# Patient Record
Sex: Female | Born: 1945 | Race: Black or African American | Hispanic: No | Marital: Single | Smoking: Never smoker
Health system: Southern US, Community
[De-identification: ages and names within clinical notes are randomized; demographics above are authoritative.]

## PROBLEM LIST (undated history)

## (undated) DIAGNOSIS — E785 Hyperlipidemia, unspecified: Secondary | ICD-10-CM

## (undated) DIAGNOSIS — E669 Obesity, unspecified: Secondary | ICD-10-CM

## (undated) HISTORY — DX: Hyperlipidemia, unspecified: E78.5

## (undated) HISTORY — PX: CATARACT EXTRACTION: SUR2

## (undated) HISTORY — PX: EYE SURGERY: SHX253

## (undated) HISTORY — DX: Obesity, unspecified: E66.9

---

## 1998-01-24 ENCOUNTER — Other Ambulatory Visit: Admission: RE | Admit: 1998-01-24 | Discharge: 1998-01-24 | Payer: Self-pay | Admitting: Family Medicine

## 1998-07-25 ENCOUNTER — Encounter: Payer: Self-pay | Admitting: Family Medicine

## 1998-07-26 ENCOUNTER — Inpatient Hospital Stay (HOSPITAL_COMMUNITY): Admission: AD | Admit: 1998-07-26 | Discharge: 1998-07-27 | Payer: Self-pay | Admitting: Family Medicine

## 2000-01-11 ENCOUNTER — Other Ambulatory Visit: Admission: RE | Admit: 2000-01-11 | Discharge: 2000-01-11 | Payer: Self-pay | Admitting: Family Medicine

## 2000-01-18 ENCOUNTER — Encounter: Payer: Self-pay | Admitting: Family Medicine

## 2000-01-18 ENCOUNTER — Ambulatory Visit (HOSPITAL_COMMUNITY): Admission: RE | Admit: 2000-01-18 | Discharge: 2000-01-18 | Payer: Self-pay | Admitting: Family Medicine

## 2000-05-25 ENCOUNTER — Observation Stay (HOSPITAL_COMMUNITY): Admission: RE | Admit: 2000-05-25 | Discharge: 2000-05-25 | Payer: Self-pay | Admitting: Obstetrics and Gynecology

## 2000-05-25 ENCOUNTER — Encounter (INDEPENDENT_AMBULATORY_CARE_PROVIDER_SITE_OTHER): Payer: Self-pay

## 2000-05-25 ENCOUNTER — Encounter (INDEPENDENT_AMBULATORY_CARE_PROVIDER_SITE_OTHER): Payer: Self-pay | Admitting: Specialist

## 2000-06-22 ENCOUNTER — Inpatient Hospital Stay (HOSPITAL_COMMUNITY): Admission: AD | Admit: 2000-06-22 | Discharge: 2000-06-22 | Payer: Self-pay | Admitting: Obstetrics and Gynecology

## 2000-11-09 ENCOUNTER — Encounter: Payer: Self-pay | Admitting: Family Medicine

## 2000-11-09 ENCOUNTER — Ambulatory Visit (HOSPITAL_COMMUNITY): Admission: RE | Admit: 2000-11-09 | Discharge: 2000-11-09 | Payer: Self-pay | Admitting: Family Medicine

## 2001-03-10 ENCOUNTER — Encounter: Payer: Self-pay | Admitting: Family Medicine

## 2001-03-10 ENCOUNTER — Ambulatory Visit (HOSPITAL_COMMUNITY): Admission: RE | Admit: 2001-03-10 | Discharge: 2001-03-10 | Payer: Self-pay | Admitting: Family Medicine

## 2002-04-27 ENCOUNTER — Encounter: Payer: Self-pay | Admitting: Family Medicine

## 2002-04-27 ENCOUNTER — Ambulatory Visit (HOSPITAL_COMMUNITY): Admission: RE | Admit: 2002-04-27 | Discharge: 2002-04-27 | Payer: Self-pay | Admitting: Family Medicine

## 2002-11-21 ENCOUNTER — Ambulatory Visit (HOSPITAL_COMMUNITY): Admission: RE | Admit: 2002-11-21 | Discharge: 2002-11-21 | Payer: Self-pay | Admitting: Family Medicine

## 2002-11-21 ENCOUNTER — Encounter: Payer: Self-pay | Admitting: Family Medicine

## 2003-05-01 ENCOUNTER — Ambulatory Visit (HOSPITAL_COMMUNITY): Admission: RE | Admit: 2003-05-01 | Discharge: 2003-05-01 | Payer: Self-pay | Admitting: Family Medicine

## 2003-05-04 HISTORY — PX: MYOMECTOMY: SHX85

## 2003-12-10 ENCOUNTER — Other Ambulatory Visit: Admission: RE | Admit: 2003-12-10 | Discharge: 2003-12-10 | Payer: Self-pay | Admitting: Family Medicine

## 2004-04-30 ENCOUNTER — Ambulatory Visit (HOSPITAL_COMMUNITY): Admission: RE | Admit: 2004-04-30 | Discharge: 2004-04-30 | Payer: Self-pay | Admitting: Orthopedic Surgery

## 2004-05-06 ENCOUNTER — Encounter (HOSPITAL_COMMUNITY): Admission: RE | Admit: 2004-05-06 | Discharge: 2004-06-05 | Payer: Self-pay | Admitting: Orthopedic Surgery

## 2004-05-27 ENCOUNTER — Ambulatory Visit (HOSPITAL_COMMUNITY): Admission: RE | Admit: 2004-05-27 | Discharge: 2004-05-27 | Payer: Self-pay | Admitting: Family Medicine

## 2005-05-31 ENCOUNTER — Ambulatory Visit (HOSPITAL_COMMUNITY): Admission: RE | Admit: 2005-05-31 | Discharge: 2005-05-31 | Payer: Self-pay | Admitting: Family Medicine

## 2006-06-01 ENCOUNTER — Ambulatory Visit (HOSPITAL_COMMUNITY): Admission: RE | Admit: 2006-06-01 | Discharge: 2006-06-01 | Payer: Self-pay | Admitting: Family Medicine

## 2007-06-05 ENCOUNTER — Ambulatory Visit (HOSPITAL_COMMUNITY): Admission: RE | Admit: 2007-06-05 | Discharge: 2007-06-05 | Payer: Self-pay | Admitting: Family Medicine

## 2008-06-05 ENCOUNTER — Ambulatory Visit (HOSPITAL_COMMUNITY): Admission: RE | Admit: 2008-06-05 | Discharge: 2008-06-05 | Payer: Self-pay | Admitting: Family Medicine

## 2008-07-15 ENCOUNTER — Encounter (INDEPENDENT_AMBULATORY_CARE_PROVIDER_SITE_OTHER): Payer: Self-pay | Admitting: Gastroenterology

## 2008-07-15 ENCOUNTER — Ambulatory Visit (HOSPITAL_COMMUNITY): Admission: RE | Admit: 2008-07-15 | Discharge: 2008-07-15 | Payer: Self-pay | Admitting: Gastroenterology

## 2008-09-16 ENCOUNTER — Encounter: Admission: RE | Admit: 2008-09-16 | Discharge: 2008-09-16 | Payer: Self-pay | Admitting: Family Medicine

## 2008-11-14 ENCOUNTER — Other Ambulatory Visit: Admission: RE | Admit: 2008-11-14 | Discharge: 2008-11-14 | Payer: Self-pay | Admitting: Family Medicine

## 2009-06-16 ENCOUNTER — Ambulatory Visit (HOSPITAL_COMMUNITY): Admission: RE | Admit: 2009-06-16 | Discharge: 2009-06-16 | Payer: Self-pay | Admitting: Family Medicine

## 2010-05-23 ENCOUNTER — Encounter: Payer: Self-pay | Admitting: Family Medicine

## 2010-05-28 ENCOUNTER — Other Ambulatory Visit (HOSPITAL_COMMUNITY): Payer: Self-pay | Admitting: Family Medicine

## 2010-05-28 DIAGNOSIS — Z139 Encounter for screening, unspecified: Secondary | ICD-10-CM

## 2010-06-18 ENCOUNTER — Ambulatory Visit (HOSPITAL_COMMUNITY)
Admission: RE | Admit: 2010-06-18 | Discharge: 2010-06-18 | Disposition: A | Discharge status: Home / Self Care | Payer: PRIVATE HEALTH INSURANCE | Source: Ambulatory Visit | Attending: Family Medicine | Admitting: Family Medicine

## 2010-06-18 ENCOUNTER — Ambulatory Visit (HOSPITAL_COMMUNITY): Payer: Self-pay

## 2010-06-18 DIAGNOSIS — Z1231 Encounter for screening mammogram for malignant neoplasm of breast: Secondary | ICD-10-CM | POA: Insufficient documentation

## 2010-06-18 DIAGNOSIS — Z139 Encounter for screening, unspecified: Secondary | ICD-10-CM

## 2010-09-18 NOTE — H&P (Signed)
Amanda Estes  Patient:    [name], Estes                      MRN: [id number] Attending:  Seymour Bars. Leo Grosser, M.D. Dictator:   Jon Billings. Elizebeth Koller.                         History and Physical  DATE OF BIRTH:                February 28, 1946  HISTORY OF PRESENT ILLNESS:   Ms. Amanda Estes is a 65 year old single African-American female para 0 who is menopausal with non-insulin-dependent diabetes complaining of a three month history of intermittent dull right lower quadrant abdominal pain.  Patient received a pelvic ultrasound January 18, 2000 which revealed a solid appearing right adnexal mass measuring just under 3 cm in maximum dimension.  She was also found to have two discreet fibroids, however, remainder of that study was within normal limits.  Patients pelvic ultrasound was followed by an MRI February 05, 2000 which revealed a 3 cm mass just to the right midline above the bladder and adjacent to the uterus that was believed to represent a pedunculated fibroid as it appeared to be connected to the uterus on the contrast enhanced images.  There were also two small fibroids within the posterior wall of the uterus measuring slightly less than 1 cm in diameter.  The remainder of that study was unremarkable.  Patient has consented to undergo a diagnostic laparoscopy in order to identify the nature of this pelvic mass and possible laparoscopic myomectomy.  PAST OBSTETRICAL HISTORY:     Negative.  PAST GYNECOLOGICAL HISTORY:   Menarche 65 years old.  Prior to menopause patient had a history of oligomenorrhea.  Her Pap smear of September 2001 and mammogram of October 2001 were both normal.  PAST MEDICAL HISTORY:         Non-insulin-dependent diabetes.  PAST SURGICAL HISTORY:        Wisdom tooth extraction.  CURRENT MEDICATIONS:          Glucophage XR, Amaryl, Actos, Accupril, HCTZ.  ALLERGIES:                    AUGMENTIN which causes shortness of breath  and FLU SHOT which cause hives.  SOCIAL HISTORY:               Patient does not smoke, drink alcohol, or use recreational drugs.  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  FAMILY HISTORY:               Positive for heart disease, breast cancer (maternal grandmother and aunt), hypertension, diabetes, and seizures.  REVIEW OF SYSTEMS:            Negative except as indicated in HPI.  PHYSICAL EXAMINATION:  VITAL SIGNS:                  Blood pressure 120/78, weight 220 pounds, height 5 feet 0.75 inches.  HEENT:                        Normal.  NECK:                         Supple without masses.  There is no thyromegaly.  LUNGS:  Without wheezes, rales, or rhonchi.  HEART:                        Regular rate and rhythm.  BACK:                         No CVA tenderness.  ABDOMEN:                      Bowel sounds are present.  Soft, nontender. There is no organomegaly.  EXTREMITIES:                  No cyanosis, clubbing, or edema.  PELVIC:                       EGBUS is within normal limits.  Vagina is normal.  Cervix is nontender without lesion.  Uterus appears normal size, shape, and consistency.  It is anterior.  Adnexa without masses or tenderness. Rectovaginal without masses or tenderness.  IMPRESSION:                   1. Right adnexal mass.                               2. History of pelvic pain.                               3. Menopausal.  DISPOSITION:                  A long discussion was held with patient regarding implications for her procedure along with risks involved to include, but not limited to, infection, reaction to anesthesia, bleeding, and damage to adjacent organs.  Patient has consented to undergo a diagnostic laparoscopy with possible laparoscopic myomectomy at [place] on Wednesday, May 25, 2000 at 7:30 a.m. DD:  05/24/00 TD:  05/24/00 Job:  [id number]

## 2010-09-18 NOTE — Op Note (Signed)
NAME:  Amanda Estes, Amanda Estes             ACCOUNT NO.:  [id number]   MEDICAL RECORD NO.:  [id number]          PATIENT TYPE:  AMB   LOCATION:  ENDO                         FACILITY:  [facility]   PHYSICIAN:  Anselmo Rod, M.D.  DATE OF BIRTH:  [birth year]   DATE OF PROCEDURE:  07/17/2008  DATE OF DISCHARGE:  07/15/2008                               OPERATIVE REPORT   PROCEDURE PERFORMED:  Colonoscopy with cold biopsies x 2.   ENDOSCOPIST:  Anselmo Rod, M.D.   INSTRUMENT USED:  Pentax video colonoscope.   INDICATIONS FOR PROCEDURE:  A 65 year old female undergoing screening  colonoscopy to rule out colonic polyps, masses, etc   PREPROCEDURE PREPARATION:  Informed consent was procured from the  patient.  The patient fasted for 4 hours prior to procedure and prepped  with Moviprep the night prior to procedure and the morning of the  procedure.  The risks and benefits of the procedure including a 10% miss  rate of cancer and polyp were discussed with the patient as well.   PREPROCEDURE PHYSICAL:  The patient had stable vital signs.  Neck  supple.  Chest clear to auscultation.  S1, S2 regular.  Abdomen soft  with normal bowel sounds.   DESCRIPTION OF PROCEDURE:  The patient was placed in left lateral  decubitus position and sedated with 100 mcg of Fentanyl and 9 mg of  Versed given intravenously in slow incremental doses.  Once the patient  was adequately sedated and maintained on low-flow oxygen and continuous  cardiac monitoring.  The Pentax video colonoscope was advanced from the  rectum to the cecum.  One small sessile polyp was biopsied at 50 cm and  was removed by cold biopsies x2.  The rest of colonoscopy up to the  terminal ileum appeared normal.  The appendiceal orifice and cecal valve  were clearly visualized and photographed.  Retroflexion in the rectum  revealed no abnormalities.  The patient tolerated the procedure well  without immediate complications.   IMPRESSION:  One  small sessile polyp removed from 50 cm (cold biopsies x  2), otherwise normal colonoscopy up to the terminal ileum.   RECOMMENDATIONS:  1. Continue high fiber diet.  2. Avoid nonsteroidals for next 2 weeks.  3. Await pathology results.  4. Repeat colonoscopy depending on pathology results.  5. Outpatient follow-up as need arises in the future.      Anselmo Rod, M.D.  Electronically Signed     JNM/MEDQ  D:  07/17/2008  T:  07/17/2008  Job:  [id number]   cc:   Renaye Rakers, M.D.  Fax: [phone]

## 2011-05-25 ENCOUNTER — Other Ambulatory Visit (HOSPITAL_COMMUNITY): Payer: Self-pay | Admitting: Family Medicine

## 2011-05-25 DIAGNOSIS — Z139 Encounter for screening, unspecified: Secondary | ICD-10-CM

## 2011-06-21 ENCOUNTER — Ambulatory Visit (HOSPITAL_COMMUNITY)
Admission: RE | Admit: 2011-06-21 | Discharge: 2011-06-21 | Disposition: A | Discharge status: Home / Self Care | Payer: 59 | Source: Ambulatory Visit | Attending: Family Medicine | Admitting: Family Medicine

## 2011-06-21 DIAGNOSIS — Z139 Encounter for screening, unspecified: Secondary | ICD-10-CM

## 2011-06-21 DIAGNOSIS — Z1231 Encounter for screening mammogram for malignant neoplasm of breast: Secondary | ICD-10-CM | POA: Insufficient documentation

## 2011-11-13 ENCOUNTER — Encounter: Payer: Self-pay | Admitting: Dietician

## 2011-11-13 ENCOUNTER — Encounter: Payer: 59 | Attending: Family Medicine | Admitting: Dietician

## 2011-11-13 NOTE — Progress Notes (Signed)
Patient was seen on 11/13/2011 for the complete series of three diabetes self-management courses at [facility].  Current A1c = 9.1% The following learning objectives were met by the patient during this course:  Core 1:  Gain an understanding of diabetes and what causes it  Learn how diabetes is treated and the goals of treatment  Learn why, how and when to test BG  Learn how carbohydrate affects your glucose  Receive a personal food plan and learn how to count carbohydrates  Discover how physical activity enhances glucose control and overall health  Begin to gain confidence in your ability to manage diabetes  Handouts given during this class include:  Type 2 Diabetes: Basics Book  My [place] and Activity Log  Core 2:  Describe causes, symptoms and treatment of hypoglycemia and hyperglycemia  Learn how to care for your glucose meter and strips  Explain how to manage diabetes during illness  List strategies to follow meal plan when dining out  Describe the effects of alcohol on glucose and how to use it safely  Describe problem solving skills for day-to-day glucose challenges  Describe ways to remain physically active  Describe the impact of regular activity on insulin resistance  Handouts given in this class:  Refrigerator magnet for Sick Day Guidelines  [facility] Oral Medication and Insulin handout  Core 3  Describe how diabetes changes over time  Understand why glucose may be out of target  Learn how diabetes changes over time  Learn about blood pressure, cholesterol, and heart health  Learn about lowering dietary fat and sodium  Understand the benefits of physical activity for heart health  Develop problem solving skills for times when glucose numbers are puzzling  Develop strategies for creating life balance  Learn how to identify if your treatment plan needs to change  Develop strategies for dealing  with stress, depression and staying motivated  Identify healthy weight-loss plans  Gain confidence that you can succeed in caring for your diabetes  Establish 2-3 goals that they will plan to diligently work on until they return                   for the free 20-month follow-up visit   The following handouts were given in class:  3 Month Follow Up Visit handout  Goal setting handout  Class evaluation form  Your patient has established the following 3 month goals for diabetes self-care:  Count carbohydrates at most of my meals and snacks  Increase aerobic activity one time a week  Take my medications as scheduled  Lose at least 10 pounds to lower my A1c  Test my BG every day and record before and 2 hours after meals  Follow-Up Plan: Patient was offered a 3 month follow-up visit for diabetes self-management education.

## 2011-11-13 NOTE — Patient Instructions (Signed)
Goals:  Follow Diabetes Meal Plan as instructed  Eat 3 meals and 2 snacks, every 3-5 hrs  Limit carbohydrate intake to 30-45 grams carbohydrate/meal  Limit carbohydrate intake to 0-15 grams carbohydrate/snack  Add lean protein foods to meals/snacks  Monitor glucose levels as instructed by your doctor  Aim for 15-30 mins of physical activity daily as tolerated  Bring food record and glucose log to your next nutrition visit 

## 2012-02-22 ENCOUNTER — Ambulatory Visit: Payer: 59 | Admitting: Dietician

## 2012-03-15 ENCOUNTER — Encounter: Payer: 59 | Attending: Family Medicine | Admitting: Dietician

## 2012-03-15 VITALS — Ht 61.0 in | Wt 197.1 lb

## 2012-03-15 DIAGNOSIS — IMO0001 Reserved for inherently not codable concepts without codable children: Secondary | ICD-10-CM | POA: Insufficient documentation

## 2012-03-15 DIAGNOSIS — Z713 Dietary counseling and surveillance: Secondary | ICD-10-CM | POA: Insufficient documentation

## 2012-03-15 NOTE — Progress Notes (Signed)
  Patient was seen on 03/15/2012 for their 3 month follow-up as a part of the diabetes self-management courses at the [facility]. The following learning objectives were met by your patient during this course:  Patient self reports the following:  Diabetes control has improved since diabetes self-management training: yes Number of days blood glucose is >200: 2 days out of 7 days Last MD appointment for diabetes: September Changes in treatment plan: none Confidence with ability to manage diabetes: Confident, but is having issues with eating at regular intervals and at night especially, not having prepared foods available that are in her restricted carb diet and she ends up "eating the wrong things."  This affects her blood glucose and does not help with weight loss Areas for improvement with diabetes self-care: 1.  Lose weight  2. Follow the carb restricted diet  3. Consistently count carbs. Willingness to participate in diabetes support group: Lives in [facility] [facility] and commute is too long.  Please see Diabetes Flow sheet for findings related to patient's self-care.  Follow-Up Plan: Patient is eligible for a "free" 30 minute diabetes self-care appointment in the next year. Patient to call and schedule as needed.

## 2012-06-08 ENCOUNTER — Other Ambulatory Visit (HOSPITAL_COMMUNITY): Payer: Self-pay | Admitting: Family Medicine

## 2012-06-08 DIAGNOSIS — Z139 Encounter for screening, unspecified: Secondary | ICD-10-CM

## 2012-06-17 ENCOUNTER — Other Ambulatory Visit: Payer: Self-pay

## 2012-06-22 ENCOUNTER — Ambulatory Visit (HOSPITAL_COMMUNITY)
Admission: RE | Admit: 2012-06-22 | Discharge: 2012-06-22 | Disposition: A | Discharge status: Home / Self Care | Payer: 59 | Source: Ambulatory Visit | Attending: Family Medicine | Admitting: Family Medicine

## 2012-06-22 DIAGNOSIS — Z139 Encounter for screening, unspecified: Secondary | ICD-10-CM

## 2012-06-22 DIAGNOSIS — Z1231 Encounter for screening mammogram for malignant neoplasm of breast: Secondary | ICD-10-CM | POA: Insufficient documentation

## 2012-12-06 ENCOUNTER — Other Ambulatory Visit: Payer: Self-pay

## 2013-03-05 ENCOUNTER — Ambulatory Visit (INDEPENDENT_AMBULATORY_CARE_PROVIDER_SITE_OTHER): Payer: Self-pay | Admitting: Family Medicine

## 2013-03-05 VITALS — BP 122/62 | HR 66 | Ht 60.0 in | Wt 198.0 lb

## 2013-03-05 DIAGNOSIS — E119 Type 2 diabetes mellitus without complications: Secondary | ICD-10-CM

## 2013-03-05 NOTE — Progress Notes (Signed)
Subjective:  Patient is here for an annual pharmacy visit as part of an employer sponsored wellness program, [employer] to [employer]. Medication list has been updated and reviewed. Current diabetes regimen includes Lantus 46 units, Invokana 300 mg once daily, metformin 2000 mg once daily, tradjenta 5 mg once daily and Novolog sliding scale (8-16 units) with meals.   No major health changes since her last appointment. Novolog and Lantus doses were increased slightly. She still has one cataract and is hoping to have it removed before the year is end. She does not have an appointment pending for the cataract removal.    She has an appointment pending to see Dr. Parke Simmers in a few weeks. She asked to have her A1C checked today.     Disease Assessments:  Diabetes:  Type of Diabetes: Type 2; Year of diagnosis 1993; Sees Diabetes provider 3 times per year; Diabetes Education will attend program group classes; MD managing Diabetes B[place]; Current Diabetes related medical conditions are High blood pressure, High cholesterol; checks feet daily; uses glucometer One Touch Ultra 2 meter; takes medications as prescribed; does not take an aspirin a day; hypoglycemia frequency 0; checks blood glucose 2 times a day;   Lowest CBG 97; Highest CBG 200; Other Diabetes History: She did not bring her CBG meter to the appointment. She has had one episode of low blood sugar- adequately treated with candy and regular soda.   Checking twice daily. Highs and low are patient reported. On average she thinks that it is running around 130-140.    Nutrition-  Patient states that she needs help with carb counting. She would like to see an RD.   B- cheese toast 1 slice, 2 slices bacon, boiled egg, piece of fruit (apple or pineapple); coffee with cream  usually no snack between breakfast and lunch.   L- mostly bringing from home. Turnip greens with jalepeno peppers; casserole chicken (sour cream, chicken soup); mostly trying to avoid  fried foods. Admits to occasionally eating fried chicken. Does try to get salad in too.   D- greens, tomatoes, cucumbers; either chicken or [place] using baking or broiling.   She admits that nightime snacking is her weakness. Sometimes in the evening while she is reading, on the computer or watching TV- she is eating PB & crackers, sometimes a piece of fruit, or a smoothie.   Physical Activity-  Going to the pool at least times a week for about an hour. Also doing some walking and elliptical machines 2-3 days a week on top of the water aerobics.      Preventive Care:    Hemoglobin A1c: 03/05/2013 7.3    Colonoscopy: 10/12/2006  Dilated Eye Exam: 07/04/2012  Flu vaccine: 01/09/2013  Foot Exam: 06/17/2011  Last mammography: 08/04/2010  Pap Smear: 09/30/2010  Pneumovax: 10/13/2006  Other Preventive Care Notes: Dental visit- every 6 months, last was in February 2014. Has an appointment pending.      Vital Signs:  03/05/2013 12:15 PM (EST) Blood Pressure 122 / 62 mm/HgBMI 38.7; Height 5 ft 0 in; Pulse Rate 66 bpm; Weight 198 lbs    Testing:  Blood Sugar Tests:  Hemoglobin A1c: 7.3 resulted on 03/05/2013    Objective:  Musculoskeletal: feet and toes normal.    Assessment/Plan: Patient is a 67 year old female with DM2. A1C today was 7.3% which is slightly above goal of less than 7%. However, this is an improvement from her previous reading of 8. Patient was very excited that her A1C  had decreased.   Patient continues with physical activity and is getting at least 150 minutes each week. She admits that she could increase her intensity while she is in the pool. We discussed how to find her heart rate and what a target heart rate for her age is. We discussed other exercises she could do in the pool to increase her intensity. Patient agreed to try the backstroke. She also wants to start walking more again. She was walking almost every day but lately has cut back.   I discussed with  patient the new wellness rewards program. I showed her how to sign up for it and claim the tobacco free and annual physical badge in addition to completing the online wellness profile. Patient does not currently meet the requirement for a healthy weight. Patient would like to see a dietitian. I will let Marylu Lund know so she can arrange the referral.   We reviewed carbohydrate counting today and patient seems to have a good knowledge of which foods contain carbohydrates. We reveiwed portion sizes and how to look up nutrition information for certain foods. Patient states that she is confident at how to interepret a food label. We discussed daily carbohydrate goals of 45-60 grams of carbohydrates with each meal and 15 grams with each snack.   I will fax A1C results to Dr. Tedra Senegal office. Marylu Lund will follow up with patient in 3 months time..   Goals for Next Visit-  1. Goal to lose weight. Increase walking during the week. When you are at the pool, check your heart rate. Goal is to be around 120 beats per minute. If you aren't reaching this, you may need to increase intensity. If you feel dizzy or light headed, slow down.  2. Be more mindful about the carbs that you are eating. When you are snacking, reach for a small piece of fruit over peanut butter crackers.   Next appointment to see Marylu Lund is Monday, February 2nd at 11:30 AM.     Ellender Hose. Vivia Ewing, PharmD, BCPS

## 2013-03-08 ENCOUNTER — Other Ambulatory Visit: Payer: Self-pay

## 2013-04-12 ENCOUNTER — Other Ambulatory Visit: Payer: Self-pay | Admitting: Family Medicine

## 2013-04-12 ENCOUNTER — Other Ambulatory Visit (HOSPITAL_COMMUNITY)
Admission: RE | Admit: 2013-04-12 | Discharge: 2013-04-12 | Disposition: A | Discharge status: Home / Self Care | Payer: 59 | Source: Ambulatory Visit | Attending: Family Medicine | Admitting: Family Medicine

## 2013-04-12 DIAGNOSIS — Z01419 Encounter for gynecological examination (general) (routine) without abnormal findings: Secondary | ICD-10-CM | POA: Insufficient documentation

## 2013-04-12 DIAGNOSIS — Z1151 Encounter for screening for human papillomavirus (HPV): Secondary | ICD-10-CM | POA: Insufficient documentation

## 2013-06-04 ENCOUNTER — Other Ambulatory Visit (HOSPITAL_COMMUNITY): Payer: Self-pay | Admitting: Family Medicine

## 2013-06-04 DIAGNOSIS — Z139 Encounter for screening, unspecified: Secondary | ICD-10-CM

## 2013-06-25 ENCOUNTER — Ambulatory Visit (HOSPITAL_COMMUNITY): Payer: 59

## 2013-06-26 ENCOUNTER — Ambulatory Visit (HOSPITAL_COMMUNITY)
Admission: RE | Admit: 2013-06-26 | Discharge: 2013-06-26 | Disposition: A | Discharge status: Home / Self Care | Payer: 59 | Source: Ambulatory Visit | Attending: Family Medicine | Admitting: Family Medicine

## 2013-06-26 ENCOUNTER — Other Ambulatory Visit (HOSPITAL_COMMUNITY): Payer: Self-pay | Admitting: Family Medicine

## 2013-06-26 DIAGNOSIS — Z1231 Encounter for screening mammogram for malignant neoplasm of breast: Secondary | ICD-10-CM

## 2013-08-09 ENCOUNTER — Ambulatory Visit (INDEPENDENT_AMBULATORY_CARE_PROVIDER_SITE_OTHER): Payer: 59 | Admitting: Otolaryngology

## 2013-08-09 DIAGNOSIS — R439 Unspecified disturbances of smell and taste: Secondary | ICD-10-CM

## 2013-08-09 DIAGNOSIS — H6990 Unspecified Eustachian tube disorder, unspecified ear: Secondary | ICD-10-CM

## 2013-08-09 DIAGNOSIS — J343 Hypertrophy of nasal turbinates: Secondary | ICD-10-CM

## 2013-08-09 DIAGNOSIS — H903 Sensorineural hearing loss, bilateral: Secondary | ICD-10-CM

## 2013-08-09 DIAGNOSIS — H6980 Other specified disorders of Eustachian tube, unspecified ear: Secondary | ICD-10-CM

## 2013-09-20 ENCOUNTER — Ambulatory Visit (INDEPENDENT_AMBULATORY_CARE_PROVIDER_SITE_OTHER): Payer: 59 | Admitting: Otolaryngology

## 2013-09-20 DIAGNOSIS — H6990 Unspecified Eustachian tube disorder, unspecified ear: Secondary | ICD-10-CM

## 2013-09-20 DIAGNOSIS — H6980 Other specified disorders of Eustachian tube, unspecified ear: Secondary | ICD-10-CM

## 2013-09-20 DIAGNOSIS — J310 Chronic rhinitis: Secondary | ICD-10-CM

## 2013-12-03 ENCOUNTER — Encounter: Payer: Self-pay | Admitting: Family Medicine

## 2013-12-03 NOTE — Progress Notes (Signed)
Patient ID: Amanda Estes, female   DOB: [birth year], 67 y.o.   MRN: [id number] Reviewed: Agree with our Pharmacologist's documentation and management.

## 2014-03-25 NOTE — Patient Instructions (Signed)
HAILEA EAGLIN  03/25/2014   Your procedure is scheduled on:  04/04/2014  Report to [facility] at 7:00 AM.  Call this number if you have problems the morning of surgery: [phone]   Remember:   Do not eat food or drink liquids after midnight.   Take these medicines the morning of surgery with A SIP OF WATER: Evista    DO NOT TAKE DIABETIC MEDICATION AM OF PROCEDURE AND 1/2 DOSE OF EVENING INSULIN   Do not wear jewelry, make-up or nail polish.  Do not wear lotions, powders, or perfumes. You may wear deodorant.  Do not shave 48 hours prior to surgery. Men may shave face and neck.  Do not bring valuables to the hospital.  [facility] is not responsible for any belongings or valuables.               Contacts, dentures or bridgework may not be worn into surgery.  Leave suitcase in the car. After surgery it may be brought to your room.  For patients admitted to the hospital, discharge time is determined by your treatment team.               Patients discharged the day of surgery will not be allowed to drive home.  Name and phone number of your driver:   Special Instructions: N/A   Please read over the following fact sheets that you were given: Anesthesia Post-op Instructions   PATIENT INSTRUCTIONS POST-ANESTHESIA  IMMEDIATELY FOLLOWING SURGERY:  Do not drive or operate machinery for the first twenty four hours after surgery.  Do not make any important decisions for twenty four hours after surgery or while taking narcotic pain medications or sedatives.  If you develop intractable nausea and vomiting or a severe headache please notify your doctor immediately.  FOLLOW-UP:  Please make an appointment with your surgeon as instructed. You do not need to follow up with anesthesia unless specifically instructed to do so.  WOUND CARE INSTRUCTIONS (if applicable):  Keep a dry clean dressing on the anesthesia/puncture wound site if there is drainage.  Once the wound has quit draining you may  leave it open to air.  Generally you should leave the bandage intact for twenty four hours unless there is drainage.  If the epidural site drains for more than 36-48 hours please call the anesthesia department.  QUESTIONS?:  Please feel free to call your physician or the hospital operator if you have any questions, and they will be happy to assist you.      Cataract Surgery  A cataract is a clouding of the lens of the eye. When a lens becomes cloudy, vision is reduced based on the degree and nature of the clouding. Surgery may be needed to improve vision. Surgery removes the cloudy lens and usually replaces it with a substitute lens (intraocular lens, IOL). LET YOUR EYE DOCTOR KNOW ABOUT:  Allergies to food or medicine.  Medicines taken including herbs, eye drops, over-the-counter medicines, and creams.  Use of steroids (by mouth or creams).  Previous problems with anesthetics or numbing medicine.  History of bleeding problems or blood clots.  Previous surgery.  Other health problems, including diabetes and kidney problems.  Possibility of pregnancy, if this applies. RISKS AND COMPLICATIONS  Infection.  Inflammation of the eyeball (endophthalmitis) that can spread to both eyes (sympathetic ophthalmia).  Poor wound healing.  If an IOL is inserted, it can later fall out of proper position. This is very uncommon.  Clouding of  the part of your eye that holds an IOL in place. This is called an "after-cataract." These are uncommon but easily treated. BEFORE THE PROCEDURE  Do not eat or drink anything except small amounts of water for 8 to 12 before your surgery, or as directed by your caregiver.  Unless you are told otherwise, continue any eye drops you have been prescribed.  Talk to your primary caregiver about all other medicines that you take (both prescription and nonprescription). In some cases, you may need to stop or change medicines near the time of your surgery. This is  most important if you are taking blood-thinning medicine.Do not stop medicines unless you are told to do so.  Arrange for someone to drive you to and from the procedure.  Do not put contact lenses in either eye on the day of your surgery. PROCEDURE There is more than one method for safely removing a cataract. Your doctor can explain the differences and help determine which is best for you. Phacoemulsification surgery is the most common form of cataract surgery.  An injection is given behind the eye or eye drops are given to make this a painless procedure.  A small cut (incision) is made on the edge of the clear, dome-shaped surface that covers the front of the eye (cornea).  A tiny probe is painlessly inserted into the eye. This device gives off ultrasound waves that soften and break up the cloudy center of the lens. This makes it easier for the cloudy lens to be removed by suction.  An IOL may be implanted.  The normal lens of the eye is covered by a clear capsule. Part of that capsule is intentionally left in the eye to support the IOL.  Your surgeon may or may not use stitches to close the incision. There are other forms of cataract surgery that require a larger incision and stitches to close the eye. This approach is taken in cases where the doctor feels that the cataract cannot be easily removed using phacoemulsification. AFTER THE PROCEDURE  When an IOL is implanted, it does not need care. It becomes a permanent part of your eye and cannot be seen or felt.  Your doctor will schedule follow-up exams to check on your progress.  Review your other medicines with your doctor to see which can be resumed after surgery.  Use eye drops or take medicine as prescribed by your doctor. Document Released: 04/08/2011 Document Revised: 09/03/2013 Document Reviewed: 04/08/2011 [facility] Patient Information 2015 [place], [place]. This information is not intended to replace advice given to you by  your health care provider. Make sure you discuss any questions you have with your health care provider.

## 2014-03-26 ENCOUNTER — Encounter (HOSPITAL_COMMUNITY)
Admission: RE | Admit: 2014-03-26 | Discharge: 2014-03-26 | Disposition: A | Discharge status: Home / Self Care | Payer: 59 | Source: Ambulatory Visit | Attending: Ophthalmology | Admitting: Ophthalmology

## 2014-03-26 ENCOUNTER — Encounter (HOSPITAL_COMMUNITY): Payer: Self-pay

## 2014-03-26 ENCOUNTER — Other Ambulatory Visit: Payer: Self-pay

## 2014-03-26 DIAGNOSIS — Z01818 Encounter for other preprocedural examination: Secondary | ICD-10-CM | POA: Diagnosis present

## 2014-03-26 LAB — BASIC METABOLIC PANEL
Anion gap: 13 (ref 5–15)
BUN: 20 mg/dL (ref 6–23)
CO2: 26 mEq/L (ref 19–32)
Calcium: 9.5 mg/dL (ref 8.4–10.5)
Chloride: 98 mEq/L (ref 96–112)
Creatinine, Ser: 0.84 mg/dL (ref 0.50–1.10)
GFR calc Af Amer: 82 mL/min — ABNORMAL LOW (ref >90)
GFR calc non Af Amer: 70 mL/min — ABNORMAL LOW (ref >90)
Glucose, Bld: 190 mg/dL — ABNORMAL HIGH (ref 70–99)
Potassium: 4.4 mEq/L (ref 3.7–5.3)
Sodium: 137 mEq/L (ref 137–147)

## 2014-03-26 LAB — HEMOGLOBIN AND HEMATOCRIT, BLOOD
HCT: 40.5 % (ref 36.0–46.0)
Hemoglobin: 13.4 g/dL (ref 12.0–15.0)

## 2014-04-03 ENCOUNTER — Encounter: Payer: Self-pay | Admitting: Nutrition

## 2014-04-03 ENCOUNTER — Encounter: Payer: 59 | Attending: Family Medicine | Admitting: Nutrition

## 2014-04-03 VITALS — Ht 61.0 in | Wt 190.8 lb

## 2014-04-03 DIAGNOSIS — E669 Obesity, unspecified: Secondary | ICD-10-CM | POA: Diagnosis not present

## 2014-04-03 DIAGNOSIS — E118 Type 2 diabetes mellitus with unspecified complications: Secondary | ICD-10-CM | POA: Diagnosis present

## 2014-04-03 DIAGNOSIS — Z794 Long term (current) use of insulin: Secondary | ICD-10-CM | POA: Diagnosis not present

## 2014-04-03 DIAGNOSIS — IMO0002 Reserved for concepts with insufficient information to code with codable children: Secondary | ICD-10-CM

## 2014-04-03 DIAGNOSIS — Z713 Dietary counseling and surveillance: Secondary | ICD-10-CM | POA: Insufficient documentation

## 2014-04-03 DIAGNOSIS — Z6835 Body mass index (BMI) 35.0-35.9, adult: Secondary | ICD-10-CM | POA: Diagnosis not present

## 2014-04-03 DIAGNOSIS — E1165 Type 2 diabetes mellitus with hyperglycemia: Secondary | ICD-10-CM

## 2014-04-03 MED ORDER — TETRACAINE HCL 0.5 % OP SOLN
OPHTHALMIC | Status: AC
  Filled 2014-04-03: qty 2

## 2014-04-03 MED ORDER — LIDOCAINE HCL (PF) 1 % IJ SOLN
INTRAMUSCULAR | Status: AC
  Filled 2014-04-03: qty 2

## 2014-04-03 MED ORDER — LIDOCAINE HCL 3.5 % OP GEL
OPHTHALMIC | Status: AC
  Filled 2014-04-03: qty 1

## 2014-04-03 MED ORDER — NEOMYCIN-POLYMYXIN-DEXAMETH 3.5-10000-0.1 OP SUSP
OPHTHALMIC | Status: AC
  Filled 2014-04-03: qty 5

## 2014-04-03 MED ORDER — CYCLOPENTOLATE-PHENYLEPHRINE OP SOLN OPTIME - NO CHARGE
OPHTHALMIC | Status: AC
  Filled 2014-04-03: qty 2

## 2014-04-03 MED ORDER — PHENYLEPHRINE HCL 2.5 % OP SOLN
OPHTHALMIC | Status: AC
  Filled 2014-04-03: qty 15

## 2014-04-03 NOTE — Progress Notes (Signed)
  Medical Nutrition Therapy:  Appt start time:1130 end time:  1230.   Assessment:  Primary concerns today: Diabetes and weight loss. Most A1C 6.4%. It has been has high at 9% 6 months ago. LIves by herself and does own cooking and shopping. Eats out twice a week. Tries to bake and broil mostly. Physical activity; goes to [facility] three times a week and does 30 minutes on other days. Has lost 2 lbs previously. Would like to lose 30-40 lbs pouinds.   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 Timing of meals may vary depending on work schedule.  Currently takes 46 units of Lantus at night and then takes 4 units of Novolog with breakfast and dinner but not lunch plus sliding scale. On Tradjenta 5 mg daily and Glucophage 1000 mg BID. Tests her blood sugars but didn' t bring them with her.  Preferred Learning Style:     Hands on  No preference indicated    Learning Readiness:  Contemplating  Ready  Change in progress   MEDICATIONS See list  DIETARY INTAKE:  24-hr recall:  B ( AM):2 slices bacon, 1 egg,  Apple, water and coffee Snk ( AM): sometimes; fruit or peanuts L ( PM): [name] restaurant; broccoli/chicken or sushi, water with lemon Snk ( PM): none D ( PM): Slaw, turnip green and polish sausage and apple, water Snk ( PM): none Beverages: water  Usual physical activity: Going to the [employer] and walking   Estimated energy needs: 1200 calories 135 g carbohydrates 90 g protein 33 g fat  Progress Towards Goal(s):  In progress.   Nutritional Diagnosis:  NB-1.1 Food and nutrition-related knowledge deficit As related to diabetes.  As evidenced by A1C of 6.4% but was >9% at one time..    Intervention:  Nutrition counseling for weight loss and diabetes education on diet, medications, timing of meals and CHO Counting and reducing complications of DM.  Plan:  Aim for 2-3arb Choices per meal (30-45 grams) +/- 1 either way  Avoid snacks between meals. Test blood sugars 2 hours  after largest meal to evaluate blood sugar control. Use MYFitness Pal or other APP to keep food journal and track food intake Include protein in moderation with your meals  Consider reading food labels for Total Carbohydrate and Fat Grams of foods Talk to a personal trainer to assist with maximizing benefits of exercising for desired weight loss. Continue checking BG at alternate times per day as directed by MD. Test before and 2 hours after lunch occasionally to see if insulin is needed at that time. Continuer taking medications as directed by MD  Goal: 1.Talk to personal trainer to help with workout 2. Cut down on the fat content of foods and increase fiber from fresh fruits and low carb vegetables 3. Lose 1/2 lb per week.  4.Check blood sugars at lunch time. 5. Bring blood sugar log and food journal at next visit.  Teaching Method Utilized:  Visual Auditory Hands on  Handouts given during visit include:  Carb Counting and Food Label handouts Meal Plan Card  The Plate Method  Barriers to learning/adherence to lifestyle change: none  Demonstrated degree of understanding via:  Teach Back   Monitoring/Evaluation:  Dietary intake, exercise, meal planning, SBG and body weight in 2 month(s).

## 2014-04-04 ENCOUNTER — Encounter (HOSPITAL_COMMUNITY): Payer: Self-pay | Admitting: *Deleted

## 2014-04-04 ENCOUNTER — Ambulatory Visit (HOSPITAL_COMMUNITY)
Admission: RE | Admit: 2014-04-04 | Discharge: 2014-04-04 | Disposition: A | Discharge status: Home / Self Care | Payer: 59 | Source: Ambulatory Visit | Attending: Ophthalmology | Admitting: Ophthalmology

## 2014-04-04 ENCOUNTER — Ambulatory Visit (HOSPITAL_COMMUNITY): Payer: 59 | Admitting: Anesthesiology

## 2014-04-04 ENCOUNTER — Encounter (HOSPITAL_COMMUNITY)
Admission: RE | Disposition: A | Discharge status: Home / Self Care | Payer: Self-pay | Source: Ambulatory Visit | Attending: Ophthalmology

## 2014-04-04 DIAGNOSIS — H25812 Combined forms of age-related cataract, left eye: Secondary | ICD-10-CM | POA: Insufficient documentation

## 2014-04-04 DIAGNOSIS — Z794 Long term (current) use of insulin: Secondary | ICD-10-CM | POA: Insufficient documentation

## 2014-04-04 DIAGNOSIS — Z881 Allergy status to other antibiotic agents status: Secondary | ICD-10-CM | POA: Insufficient documentation

## 2014-04-04 DIAGNOSIS — Z888 Allergy status to other drugs, medicaments and biological substances status: Secondary | ICD-10-CM | POA: Insufficient documentation

## 2014-04-04 DIAGNOSIS — E119 Type 2 diabetes mellitus without complications: Secondary | ICD-10-CM | POA: Diagnosis not present

## 2014-04-04 DIAGNOSIS — I10 Essential (primary) hypertension: Secondary | ICD-10-CM | POA: Insufficient documentation

## 2014-04-04 DIAGNOSIS — Z6835 Body mass index (BMI) 35.0-35.9, adult: Secondary | ICD-10-CM | POA: Diagnosis not present

## 2014-04-04 HISTORY — PX: CATARACT EXTRACTION W/PHACO: SHX586

## 2014-04-04 LAB — GLUCOSE, CAPILLARY: Glucose-Capillary: 129 mg/dL — ABNORMAL HIGH (ref 70–99)

## 2014-04-04 SURGERY — PHACOEMULSIFICATION, CATARACT, WITH IOL INSERTION
Anesthesia: Monitor Anesthesia Care | Site: Eye | Laterality: Left

## 2014-04-04 MED ORDER — NEOMYCIN-POLYMYXIN-DEXAMETH 3.5-10000-0.1 OP SUSP
OPHTHALMIC | Status: DC | PRN
  Administered 2014-04-04: 1 [drp] via OPHTHALMIC

## 2014-04-04 MED ORDER — LACTATED RINGERS IV SOLN
INTRAVENOUS | Status: DC
  Administered 2014-04-04: 1000 mL via INTRAVENOUS

## 2014-04-04 MED ORDER — LIDOCAINE 3.5 % OP GEL OPTIME - NO CHARGE
OPHTHALMIC | Status: DC | PRN
  Administered 2014-04-04: 1 [drp] via OPHTHALMIC

## 2014-04-04 MED ORDER — EPINEPHRINE HCL 1 MG/ML IJ SOLN
INTRAOCULAR | Status: DC | PRN
  Administered 2014-04-04: 500 mL

## 2014-04-04 MED ORDER — TETRACAINE HCL 0.5 % OP SOLN
1.0000 [drp] | OPHTHALMIC | Status: AC
  Administered 2014-04-04 (×3): 1 [drp] via OPHTHALMIC

## 2014-04-04 MED ORDER — LIDOCAINE HCL 3.5 % OP GEL
1.0000 "application " | Freq: Once | OPHTHALMIC | Status: AC
  Administered 2014-04-04: 1 via OPHTHALMIC

## 2014-04-04 MED ORDER — FENTANYL CITRATE 0.05 MG/ML IJ SOLN
25.0000 ug | INTRAMUSCULAR | Status: AC
  Administered 2014-04-04: 25 ug via INTRAVENOUS

## 2014-04-04 MED ORDER — MIDAZOLAM HCL 2 MG/2ML IJ SOLN
INTRAMUSCULAR | Status: AC
  Filled 2014-04-04: qty 2

## 2014-04-04 MED ORDER — BSS IO SOLN
INTRAOCULAR | Status: DC | PRN
  Administered 2014-04-04: 30 mL via INTRAOCULAR

## 2014-04-04 MED ORDER — EPINEPHRINE HCL 1 MG/ML IJ SOLN
INTRAMUSCULAR | Status: AC
  Filled 2014-04-04: qty 1

## 2014-04-04 MED ORDER — LIDOCAINE HCL (PF) 1 % IJ SOLN
INTRAOCULAR | Status: DC | PRN
  Administered 2014-04-04: .8 mL via OPHTHALMIC

## 2014-04-04 MED ORDER — PROVISC 10 MG/ML IO SOLN
INTRAOCULAR | Status: DC | PRN
  Administered 2014-04-04: 0.85 mL via INTRAOCULAR

## 2014-04-04 MED ORDER — PHENYLEPHRINE HCL 2.5 % OP SOLN
1.0000 [drp] | OPHTHALMIC | Status: AC
  Administered 2014-04-04 (×3): 1 [drp] via OPHTHALMIC

## 2014-04-04 MED ORDER — FENTANYL CITRATE 0.05 MG/ML IJ SOLN
INTRAMUSCULAR | Status: AC
  Filled 2014-04-04: qty 2

## 2014-04-04 MED ORDER — MIDAZOLAM HCL 2 MG/2ML IJ SOLN
1.0000 mg | INTRAMUSCULAR | Status: DC | PRN
  Administered 2014-04-04: 2 mg via INTRAVENOUS

## 2014-04-04 MED ORDER — CYCLOPENTOLATE-PHENYLEPHRINE 0.2-1 % OP SOLN
1.0000 [drp] | OPHTHALMIC | Status: AC
  Administered 2014-04-04 (×3): 1 [drp] via OPHTHALMIC

## 2014-04-04 MED ORDER — POVIDONE-IODINE 5 % OP SOLN
OPHTHALMIC | Status: DC | PRN
  Administered 2014-04-04: 1 via OPHTHALMIC

## 2014-04-04 SURGICAL SUPPLY — 33 items
CAPSULAR TENSION RING-AMO (OPHTHALMIC RELATED) IMPLANT
CLOTH BEACON ORANGE TIMEOUT ST (SAFETY) ×2 IMPLANT
EYE SHIELD UNIVERSAL CLEAR (GAUZE/BANDAGES/DRESSINGS) ×2 IMPLANT
GLOVE BIO SURGEON STRL SZ 6.5 (GLOVE) IMPLANT
GLOVE BIOGEL PI IND STRL 6.5 (GLOVE) ×2 IMPLANT
GLOVE BIOGEL PI IND STRL 7.0 (GLOVE) IMPLANT
GLOVE BIOGEL PI IND STRL 7.5 (GLOVE) IMPLANT
GLOVE BIOGEL PI INDICATOR 6.5 (GLOVE) ×2
GLOVE BIOGEL PI INDICATOR 7.0 (GLOVE)
GLOVE BIOGEL PI INDICATOR 7.5 (GLOVE)
GLOVE ECLIPSE 6.5 STRL STRAW (GLOVE) IMPLANT
GLOVE ECLIPSE 7.0 STRL STRAW (GLOVE) IMPLANT
GLOVE ECLIPSE 7.5 STRL STRAW (GLOVE) IMPLANT
GLOVE EXAM NITRILE LRG STRL (GLOVE) IMPLANT
GLOVE EXAM NITRILE MD LF STRL (GLOVE) IMPLANT
GLOVE SKINSENSE NS SZ6.5 (GLOVE)
GLOVE SKINSENSE NS SZ7.0 (GLOVE)
GLOVE SKINSENSE STRL SZ6.5 (GLOVE) IMPLANT
GLOVE SKINSENSE STRL SZ7.0 (GLOVE) IMPLANT
KIT VITRECTOMY (OPHTHALMIC RELATED) IMPLANT
PAD ARMBOARD 7.5X6 YLW CONV (MISCELLANEOUS) ×2 IMPLANT
PROC W NO LENS (INTRAOCULAR LENS)
PROC W SPEC LENS (INTRAOCULAR LENS)
PROCESS W NO LENS (INTRAOCULAR LENS) IMPLANT
PROCESS W SPEC LENS (INTRAOCULAR LENS) IMPLANT
RETRACTOR IRIS SIGHTPATH (OPHTHALMIC RELATED) IMPLANT
RING MALYGIN (MISCELLANEOUS) IMPLANT
SIGHTPATH CAT PROC W REG LENS (Ophthalmic Related) ×2 IMPLANT
SYRINGE LUER LOK 1CC (MISCELLANEOUS) ×2 IMPLANT
TAPE SURG TRANSPORE 1 IN (GAUZE/BANDAGES/DRESSINGS) ×1 IMPLANT
TAPE SURGICAL TRANSPORE 1 IN (GAUZE/BANDAGES/DRESSINGS) ×2
VISCOELASTIC ADDITIONAL (OPHTHALMIC RELATED) IMPLANT
WATER STERILE IRR 250ML POUR (IV SOLUTION) ×2 IMPLANT

## 2014-04-04 NOTE — H&P (Signed)
I have reviewed the H&P, the patient was re-examined, and I have identified no interval changes in medical condition and plan of care since the history and physical of record  

## 2014-04-04 NOTE — Op Note (Signed)
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  Assistants: None  Anesthesia: Topical with MAC  Indications: Painless, progressive loss of vision with compromise of daily activities.  Surgery: Cataract Extraction with Intraocular lens Implant Left Eye  Discription: The patient had dilating drops and viscous lidocaine placed into the Left eye in the pre-op holding area. After transfer to the operating room, a time out was performed. The patient was then prepped and draped. Beginning with a 28 degree blade a paracentesis port was made at the surgeon's 2 o'clock position. The anterior chamber was then filled with 1% non-preserved lidocaine. This was followed by filling the anterior chamber with Provisc.  A 2.29mm keratome blade was used to make a clear corneal incision at the temporal limbus.  A bent cystatome needle was used to create a continuous tear capsulotomy. Hydrodissection was performed with balanced salt solution on a Fine canula. The lens nucleus was then removed using the phacoemulsification handpiece. Residual cortex was removed with the I&A handpiece. The anterior chamber and capsular bag were refilled with Provisc. A posterior chamber intraocular lens was placed into the capsular bag with it's injector. The implant was positioned with the Kuglan hook. The Provisc was then removed from the anterior chamber and capsular bag with the I&A handpiece. Stromal hydration of the main incision and paracentesis port was performed with BSS on a Fine canula. The wounds were tested for leak which was negative. The patient tolerated the procedure well. There were no operative complications. The patient was then transferred to the recovery room in stable condition.  Complications: None  Specimen: None  EBL: None  Prosthetic device: Hoya iSert 250, power 23.5 D, SN  [id number].

## 2014-04-04 NOTE — Anesthesia Preprocedure Evaluation (Signed)
Anesthesia Evaluation  Patient identified by MRN, date of birth, ID band Patient awake    Reviewed: Allergy & Precautions, H&P , NPO status , Patient's Chart, lab work & pertinent test results  Airway Mallampati: II  TM Distance: >3 FB     Dental  (+) Teeth Intact, Partial Upper   Pulmonary neg pulmonary ROS,  breath sounds clear to auscultation        Cardiovascular negative cardio ROS  Rhythm:Regular Rate:Normal     Neuro/Psych    GI/Hepatic negative GI ROS,   Endo/Other  diabetes, Well Controlled, Type 2, Insulin DependentMorbid obesity  Renal/GU      Musculoskeletal   Abdominal   Peds  Hematology   Anesthesia Other Findings   Reproductive/Obstetrics                             Anesthesia Physical Anesthesia Plan  ASA: III  Anesthesia Plan: MAC   Post-op Pain Management:    Induction: Intravenous  Airway Management Planned: Nasal Cannula  Additional Equipment:   Intra-op Plan:   Post-operative Plan:   Informed Consent: I have reviewed the patients History and Physical, chart, labs and discussed the procedure including the risks, benefits and alternatives for the proposed anesthesia with the patient or authorized representative who has indicated his/her understanding and acceptance.     Plan Discussed with:   Anesthesia Plan Comments:         Anesthesia Quick Evaluation

## 2014-04-04 NOTE — Discharge Instructions (Signed)

## 2014-04-04 NOTE — Anesthesia Postprocedure Evaluation (Signed)
  Anesthesia Post-op Note  Patient: Amanda Estes  Procedure(s) Performed: Procedure(s) with comments: CATARACT EXTRACTION PHACO AND INTRAOCULAR LENS PLACEMENT LEFT EYE (Left) - CDE: 13.36  Patient Location: Short Stay  Anesthesia Type:MAC  Level of Consciousness: awake, alert , oriented and patient cooperative  Airway and Oxygen Therapy: Patient Spontanous Breathing  Post-op Pain: none  Post-op Assessment: Post-op Vital signs reviewed, Patient's Cardiovascular Status Stable, Respiratory Function Stable, Patent Airway, No signs of Nausea or vomiting and Pain level controlled  Post-op Vital Signs: Reviewed and stable  Last Vitals:  Filed Vitals:   04/04/14 0950  BP: 137/68  Pulse:   Temp:   Resp: 16    Complications: No apparent anesthesia complications

## 2014-04-04 NOTE — Transfer of Care (Signed)
Immediate Anesthesia Transfer of Care Note  Patient: Amanda Estes  Procedure(s) Performed: Procedure(s) with comments: CATARACT EXTRACTION PHACO AND INTRAOCULAR LENS PLACEMENT LEFT EYE (Left) - CDE: 13.36  Patient Location: Short Stay  Anesthesia Type:MAC  Level of Consciousness: awake, alert  and patient cooperative  Airway & Oxygen Therapy: Patient Spontanous Breathing  Post-op Assessment: Report given to PACU RN, Post -op Vital signs reviewed and stable and Patient moving all extremities  Post vital signs: Reviewed and stable  Complications: No apparent anesthesia complications

## 2014-04-04 NOTE — Patient Instructions (Signed)
Plan:  Aim for 2-3arb Choices per meal (30-45 grams) +/- 1 either way  Avoid snacks between meals. Test blood sugars 2 hours after largest meal to evaluate blood sugar control. Use MYFitness Pal or other APP to keep food journal and track food intake Include protein in moderation with your meals  Consider reading food labels for Total Carbohydrate and Fat Grams of foods Talk to a personal trainer to assist with maximizing benefits of exercising for desired weight loss. Continue checking BG at alternate times per day as directed by MD. Test before and 2 hours after lunch occasionally to see if insulin is needed at that time. Continuer taking medications as directed by MD  Goal: 1.Talk to personal trainer to help with workout 2. Cut down on the fat content of foods and increase fiber from fresh fruits and low carb vegetables 3. Lose 1/2 lb per week.  4.Check blood sugars at lunch time. 5. Bring blood sugar log and food journal at next visit.

## 2014-04-05 ENCOUNTER — Encounter (HOSPITAL_COMMUNITY): Payer: Self-pay | Admitting: Ophthalmology

## 2014-05-09 ENCOUNTER — Encounter: Payer: 59 | Attending: Family Medicine | Admitting: Nutrition

## 2014-05-09 VITALS — Ht 61.0 in | Wt 181.0 lb

## 2014-05-09 DIAGNOSIS — Z6835 Body mass index (BMI) 35.0-35.9, adult: Secondary | ICD-10-CM | POA: Diagnosis not present

## 2014-05-09 DIAGNOSIS — IMO0002 Reserved for concepts with insufficient information to code with codable children: Secondary | ICD-10-CM

## 2014-05-09 DIAGNOSIS — Z713 Dietary counseling and surveillance: Secondary | ICD-10-CM | POA: Diagnosis not present

## 2014-05-09 DIAGNOSIS — E118 Type 2 diabetes mellitus with unspecified complications: Secondary | ICD-10-CM | POA: Insufficient documentation

## 2014-05-09 DIAGNOSIS — Z794 Long term (current) use of insulin: Secondary | ICD-10-CM | POA: Diagnosis not present

## 2014-05-09 DIAGNOSIS — E1165 Type 2 diabetes mellitus with hyperglycemia: Secondary | ICD-10-CM

## 2014-05-09 DIAGNOSIS — E669 Obesity, unspecified: Secondary | ICD-10-CM | POA: Insufficient documentation

## 2014-05-09 NOTE — Progress Notes (Signed)
  Medical Nutrition Therapy:  Appt start time:1130 end time:  1230.   Assessment:  Primary concerns today: Diabetes and weight loss.  Has kept food journal which has helped her become  Sees Dr. Criss Rosales and will get A1C done in end of January. Lost 9 lbs in the last month. Came off Novolog and reduceed Lantus from 46 units down to 22 due to having low blood sugars. She cut out a lot of snacks and junk food and eating more fresh fruits and vegetables. Takes her food with her to work. She is keeping a food journal and it is helping her increase awareness and accountability of what she is eating. She is exercising a lot more and has a FITBIT to track her exercise and water intake.  Preferred Learning Style:     Hands on  No preference indicated    Learning Readiness:  Contemplating  Ready  Change in progress   MEDICATIONS See list  DIETARY INTAKE:  24-hr recall:  B ( AM):Oatmeal or sausage and toast or boiled egg, water Snk ( AM): cut out snacks. L ( PM): 2 veggies and protein-chicken or [place] and now taking her lunch to work, water Snk ( PM): none D ( PM): [place], coleslaw and bake potatoes Snk ( PM): none Beverages: water  Usual physical activity: Trying to get in 10,000 steps now daily; going to the [facility] and  5 days a week.  Estimated energy needs: 1200 calories 135 g carbohydrates 90 g protein 33 g fat  Progress Towards Goal(s):  In progress.   Nutritional Diagnosis:  NB-1.1 Food and nutrition-related knowledge deficit As related to diabetes.  As evidenced by A1C of 6.4% but was >9% at one time..    Intervention:  Nutrition counseling for weight loss and diabetes education on diet, medications, timing of meals and CHO Counting and reducing complications of DM.  Plan:  Keep up the [place]!! Aim for 2-3 carb Choices per meal (30-45 grams) +/- 1 either way  Avoid snacks between meals. Test blood sugars 2 hours after largest meal to evaluate blood sugar  control. Use MYFitness Pal or other APP to keep food journal and track food intake Include protein in moderation with your meals  Consider reading food labels for Total Carbohydrate and Fat Grams of foods Talk to a personal trainer to assist with maximizing benefits of exercising for desired weight loss. Continue checking BG at alternate times per day as directed by MD. Test before and 2 hours after lunch occasionally to see if insulin is needed at that time. Continuer taking medications as directed by MD  Goal: 1.Get A1C down to 5.6%. 2 Lose 2 lbs per month.  Teaching Method Utilized:  Visual Auditory Hands on  Handouts given during visit include:  Carb Counting and Food Label handouts Meal Plan Card  The Plate Method  Barriers to learning/adherence to lifestyle change: none  Demonstrated degree of understanding via:  Teach Back   Monitoring/Evaluation:  Dietary intake, exercise, meal planning, SBG and body weight in 2 month(s).

## 2014-05-09 NOTE — Patient Instructions (Signed)
Plan:  Keep up the [place]!! Aim for 2-3 carb Choices per meal (30-45 grams) +/- 1 either way  Avoid snacks between meals. Test blood sugars 2 hours after largest meal to evaluate blood sugar control. Use MYFitness Pal or other APP to keep food journal and track food intake Include protein in moderation with your meals  Consider reading food labels for Total Carbohydrate and Fat Grams of foods Talk to a personal trainer to assist with maximizing benefits of exercising for desired weight loss. Continue checking BG at alternate times per day as directed by MD. Test before and 2 hours after lunch occasionally to see if insulin is needed at that time. Continuer taking medications as directed by MD  Goal: 1.Get A1C down to 5.6%. 2 Lose 2 lbs per month.

## 2014-06-06 ENCOUNTER — Other Ambulatory Visit (HOSPITAL_COMMUNITY): Payer: Self-pay | Admitting: Family Medicine

## 2014-06-06 DIAGNOSIS — Z1231 Encounter for screening mammogram for malignant neoplasm of breast: Secondary | ICD-10-CM

## 2014-06-27 ENCOUNTER — Ambulatory Visit (HOSPITAL_COMMUNITY)
Admission: RE | Admit: 2014-06-27 | Discharge: 2014-06-27 | Disposition: A | Discharge status: Home / Self Care | Payer: 59 | Source: Ambulatory Visit | Attending: Family Medicine | Admitting: Family Medicine

## 2014-06-27 DIAGNOSIS — Z1231 Encounter for screening mammogram for malignant neoplasm of breast: Secondary | ICD-10-CM | POA: Diagnosis present

## 2014-08-08 ENCOUNTER — Encounter: Payer: 59 | Attending: Family Medicine | Admitting: Nutrition

## 2014-08-08 VITALS — Ht 61.0 in | Wt 172.0 lb

## 2014-08-08 DIAGNOSIS — E1165 Type 2 diabetes mellitus with hyperglycemia: Secondary | ICD-10-CM

## 2014-08-08 DIAGNOSIS — Z713 Dietary counseling and surveillance: Secondary | ICD-10-CM | POA: Diagnosis not present

## 2014-08-08 DIAGNOSIS — E118 Type 2 diabetes mellitus with unspecified complications: Secondary | ICD-10-CM | POA: Diagnosis present

## 2014-08-08 DIAGNOSIS — Z794 Long term (current) use of insulin: Secondary | ICD-10-CM | POA: Insufficient documentation

## 2014-08-08 DIAGNOSIS — IMO0002 Reserved for concepts with insufficient information to code with codable children: Secondary | ICD-10-CM

## 2014-08-09 ENCOUNTER — Encounter: Payer: Self-pay | Admitting: Nutrition

## 2014-08-09 NOTE — Progress Notes (Signed)
  Medical Nutrition Therapy:  Appt start time:1200 end time:  1215.  Assessment:  Primary concerns today: Diabetes and weight loss.  Has kept food journal which has helped her become more aware of what she is eating.  She is doing the [facility] Weight loss Challenge. Wears a pedometer and doing more than 10000 steps per day.  Lost 9 lbs since last visit.   FBS 120's. Before lunch 110's and bedtime 130-140's.  Will get another A1C on Monday.    Eating more consist meals, more fresh fruits and vegetables and has cut out sweets, and processed foods.  No recent changes in her medications recently.Marland Kitchen     Preferred Learning Style:   Reading  Hands on   Learning Readiness:  Ready  Change in progress   MEDICATIONS See list  DIETARY INTAKE:  24-hr recall:  Eating 3 balanced meals now. Drinking lots of water. FitBit keeps her on task of drinking water, moving and make sure she is eating on time.  Usual physical activity: Trying to get in 10,000 steps now daily; going to the [facility] and  5 days a week.  Estimated energy needs: 1200 calories 135 g carbohydrates 90 g protein 33 g fat  Progress Towards Goal(s):  In progress.   Nutritional Diagnosis:  NB-1.1 Food and nutrition-related knowledge deficit As related to diabetes.  As evidenced by A1C of 6.4% but was >9% at one time..    Intervention:  Meal planning, CHO counting, benefits of exercise on insulin resisitance.  Plan:  Keep up the [place]!! Aim for 2-3 carb Choices per meal (30-45 grams) +/- 1 either way  Goal: 1.Get A1C down to 5.6%. 2 Lose 2 lbs per month. Keep exercising!!   Teaching Method Utilized:  Visual Auditory Hands on  Handouts given during visit include:  Carb Counting and Food Label handouts Meal Plan Card  The Plate Method  Barriers to learning/adherence to lifestyle change: none  Demonstrated degree of understanding via:  Teach Back   Monitoring/Evaluation:  Dietary intake, exercise, meal planning,  SBG and body weight in PRN.

## 2014-08-09 NOTE — Patient Instructions (Signed)
  Plan:  Keep up the [place]!! Aim for 2-3 carb Choices per meal (30-45 grams) +/- 1 either way  Goal: 1.Get A1C down to 5.6%. 2 Lose 2 lbs per month. Keep exercising!!

## 2014-08-12 ENCOUNTER — Other Ambulatory Visit: Payer: Self-pay | Admitting: *Deleted

## 2014-08-12 ENCOUNTER — Encounter: Payer: Self-pay | Admitting: *Deleted

## 2014-08-12 VITALS — BP 100/60 | HR 70 | Ht 60.0 in | Wt 167.0 lb

## 2014-08-12 DIAGNOSIS — E119 Type 2 diabetes mellitus without complications: Secondary | ICD-10-CM

## 2014-08-12 NOTE — Patient Outreach (Signed)
[facility]) Care Management   08/12/2014  JARIANA SHUMARD 10/10/1945 [id number]  RODINA PINALES is an 69 y.o. female who presents for routine Link To Wellness follow up.   Subjective:  Austynn has no complaints. States she is participating in the [facility] digital care initiative and is required to check her blood sugar twice daily. Says she checks a fasting and before supper blood sugar. The initiative will last 12 weeks. She also uses the My Fitness Pal app to help her count carbohydrates. She also says she will be attending a class called "[place]" offered by the RD she is seeing [employer].  She also states her long term weight loss goal is to weight 150 lbs.  Objective:   Review of Systems  Constitutional: Negative.     Physical Exam  Constitutional: She is oriented to person, place, and time.  Cardiovascular: Normal rate and regular rhythm.   Neurological: She is alert and oriented to person, place, and time.  Psychiatric: She has a normal mood and affect. Her behavior is normal. Judgment and thought content normal.   Filed Weights   08/12/14 1006  Weight: 167 lb (75.751 kg)   Filed Vitals:   08/12/14 1006  BP: 100/60  Pulse: 70    Current Medications:   Current Outpatient Prescriptions  Medication Sig Dispense Refill  . Canagliflozin (INVOKANA) 300 MG TABS Take 300 mg by mouth daily.    Marland Kitchen ezetimibe (ZETIA) 10 MG tablet Take 10 mg by mouth daily.    . hydrochlorothiazide (HYDRODIURIL) 25 MG tablet Take 25 mg by mouth daily.    . insulin glargine (LANTUS) 100 UNIT/ML injection Inject 46 Units into the skin at bedtime.     Marland Kitchen linagliptin (TRADJENTA) 5 MG TABS tablet Take 5 mg by mouth daily.    . metFORMIN (GLUCOPHAGE) 1000 MG tablet Take 2,000 mg by mouth daily.    . quinapril (ACCUPRIL) 40 MG tablet Take 40 mg by mouth at bedtime.    . raloxifene (EVISTA) 60 MG tablet Take 60 mg by mouth daily.    . clindamycin  (CLEOCIN) 300 MG capsule Take 300 mg by mouth 3 (three) times daily.    . insulin aspart (NOVOLOG) 100 UNIT/ML injection Inject into the skin 3 (three) times daily before meals.    . mometasone (NASONEX) 50 MCG/ACT nasal spray Place 2 sprays into the nose daily.     No current facility-administered medications for this visit.    Functional Status:   In your present state of health, do you have any difficulty performing the following activities: 08/12/2014 03/26/2014  Hearing? N N  Vision? N Y  Difficulty concentrating or making decisions? N N  Walking or climbing stairs? N N  Dressing or bathing? N N  Doing errands, shopping? N N    Fall/Depression Screening:    PHQ 2/9 Scores 04/03/2014  PHQ - 2 Score 0   THN CM Care Plan        Patient Outreach from 08/12/2014 in [place] Problem One  Type II DM not meeting A1C target of <7.0% as evidenced by most recent A1C= 7.6% as stated by patient   Care Plan for Problem One  Active   Interventions for Problem One Long Term Goal  congratulated Shelita on her voluntary participation in the [employer] care initiative and her ongoing weight loss and consistent exercise program, reviewed effects of exercise and  weight loss on glucose control, reviewed DM medications and reveiwed mechanism of action, encouraged patient to attend the [facility] To [facility]ness class and to continue to see Jearld Fenton RD  for ongoing assistance with medical nutritional management  of Type II DM, reviewed blood sugar values and pre and post meal targets and stategies to meet targets consistently, arranged for 3 month Link To Wellness follow up   [facility] Long Term Goal (31-90 days)  Improved glycemic control as evidenced by improved A1C of <7.0% and ongoing weight loss or no weight gain at next Link To Wellness visiti.   THN Long Term Goal Start Date  08/12/14      Assessment:   Type II DM not meeting target A1C of <7.0% as evidenced  by most recent A1C= 7.6% as stated by patient  Plan: RNCM to fax today's office visit note to Dr. Criss Rosales. RNCM will meet quarterly and as needed with patient per Link To Wellness program guidelines to assist with Type II DM self-management and assess patient's progress toward mutually set goals.     Barrington Ellison RN,CCM,CDE [place] Management Coordinator Office Phone [phone] Office Fax [phone]

## 2014-12-09 ENCOUNTER — Other Ambulatory Visit: Payer: Self-pay | Admitting: *Deleted

## 2014-12-09 NOTE — Patient Outreach (Signed)
[facility]) Care Management   12/09/2014  Amanda Estes 1945-11-01 [id number]  Amanda Estes is an 69 y.o. female who presents for routine Link To Wellness follow up for self management assistance with Type II DM.  Subjective: Amanda Estes has no complaints. States she is frustrated that she has gained back 3 lbs. She continues to exercise regularly but says since the [name] study has ended, she is not counting calories like she was before. She has started walking twice daily with a friend for a total of 2 hours daily. She continues to wear her Fit Bit and participate in challenges with other users. Says she saw Dr. Criss Rosales on 08/12/14 and was told she is doing well.  Objective:   Review of Systems  Constitutional: Negative.    Filed Vitals:   12/09/14 0930  BP: 108/70   Filed Weights   12/09/14 0930  Weight: 166 lb (75.297 kg)   Physical Exam  Constitutional: She is oriented to person, place, and time. She appears well-developed and well-nourished.  Neurological: She is alert and oriented to person, place, and time.  Skin: Skin is warm and dry.  Psychiatric: She has a normal mood and affect. Her behavior is normal. Judgment and thought content normal.    Current Medications:   Current Outpatient Prescriptions  Medication Sig Dispense Refill  . Canagliflozin (INVOKANA) 300 MG TABS Take 300 mg by mouth daily.    Marland Kitchen ezetimibe (ZETIA) 10 MG tablet Take 10 mg by mouth daily.    . hydrochlorothiazide (HYDRODIURIL) 25 MG tablet Take 25 mg by mouth daily.    . insulin glargine (LANTUS) 100 UNIT/ML injection Inject 46 Units into the skin at bedtime.     Marland Kitchen linagliptin (TRADJENTA) 5 MG TABS tablet Take 5 mg by mouth daily.    . metFORMIN (GLUCOPHAGE) 1000 MG tablet Take 2,000 mg by mouth daily.    . quinapril (ACCUPRIL) 40 MG tablet Take 40 mg by mouth at bedtime.    . raloxifene (EVISTA) 60 MG tablet Take 60 mg by mouth daily.    . clindamycin (CLEOCIN) 300 MG  capsule Take 300 mg by mouth 3 (three) times daily.    . insulin aspart (NOVOLOG) 100 UNIT/ML injection Inject into the skin 3 (three) times daily before meals.    . mometasone (NASONEX) 50 MCG/ACT nasal spray Place 2 sprays into the nose daily.     No current facility-administered medications for this visit.    Functional Status:   In your present state of health, do you have any difficulty performing the following activities: 08/12/2014 03/26/2014  Hearing? N N  Vision? N Y  Difficulty concentrating or making decisions? N N  Walking or climbing stairs? N N  Dressing or bathing? N N  Doing errands, shopping? N N    Fall/Depression Screening:    PHQ 2/9 Scores 04/03/2014  PHQ - 2 Score 0   THN CM Care Plan Problem One        Patient Outreach from 12/09/2014 in [place] Problem One  Type II DM now meeting A1C target of <7.0% as evidenced by most recent A1C= 6.6% as stated by patient after [place] study was concluded last month   Care Plan for Problem One  Active   THN Long Term Goal (31-90 days)  Continued good glycemic control as evidenced by A1C of <7.0% and ongoing weight loss or no weight gain at next Link To Wellness visit in December.  THN Long Term Goal Start Date  08/12/14   Interventions for Problem One Long Term Goal  congratulated Amanda Estes on completing the [employer] and improving her A1C, discussed strategies to overcome weight loss plateau, congratulated Amanda Estes on her consistent exercise program, reviewed effects of exercise and weight loss on glucose control, reviewed DM medications and reviewed mechanism of action, congratulated her on attending the Spring To Wellness class, reviewed blood sugar values and pre and post meal targets and stategies to meet targets consistently, arranged for Link To Wellness follow up in December      Assessment:   [name] employee and Link To Wellness member with Type II DM, HTN and  hyperlipidemia now meeting all treatment targets.  Plan:  RNCM to fax today's office visit note to Dr. Criss Rosales [facility] will meet quarterly and as needed with patient per Link To Wellness program guidelines to assist with Type II DM, HTN and hyperlipidemia self-management and assess patient's progress toward mutually set goals.  Barrington Ellison RN,CCM,CDE [place] Management Coordinator Link To Wellness Office Phone [phone] Office Fax [phone]

## 2014-12-10 ENCOUNTER — Encounter: Payer: Self-pay | Admitting: *Deleted

## 2015-04-07 ENCOUNTER — Other Ambulatory Visit: Payer: Self-pay | Admitting: *Deleted

## 2015-04-07 ENCOUNTER — Encounter: Payer: Self-pay | Admitting: *Deleted

## 2015-04-07 NOTE — Patient Outreach (Signed)
[place] [facility]) Care Management   04/07/2015  Amanda Estes May 27, 1945 [id number]  Amanda Estes is an 69 y.o. female who presents to the [place] Management office for routine Link To Wellness follow up for self management assistance with Type II DM, HTN and hyperlipidemia.  Subjective:  Amanda Estes says she has gained back 8 lbs that she had previously lost due to poor food choices during the holiday season. She says once she was no longer in a formal wellness group like [facility], she went back to her old eating habits and stopped recording her food intake. She says [place] [facility] She remains adherent with her medications. She says she was to see Dr. Criss Rosales last month and plans to make an appointment for her annual wellness exam and her dental cleaning and her eye exam for sometime this month.  Objective:   Review of Systems  Constitutional: Negative.     Physical Exam  Constitutional: She is oriented to person, place, and time. She appears well-developed and well-nourished.  Neurological: She is alert and oriented to person, place, and time.  Skin: Skin is warm and dry.  Psychiatric: She has a normal mood and affect. Her behavior is normal. Judgment and thought content normal.   Filed Weights   04/07/15 0915  Weight: 175 lb (79.379 kg)   Filed Vitals:   04/07/15 0915  BP: 104/60    Current Medications:   Current Outpatient Prescriptions  Medication Sig Dispense Refill  . Canagliflozin (INVOKANA) 300 MG TABS Take 300 mg by mouth daily.    Marland Kitchen ezetimibe (ZETIA) 10 MG tablet Take 10 mg by mouth daily.    . hydrochlorothiazide (HYDRODIURIL) 25 MG tablet Take 25 mg by mouth daily.    . insulin glargine (LANTUS) 100 UNIT/ML injection Inject 46 Units into the skin at bedtime.     Marland Kitchen linagliptin (TRADJENTA) 5 MG TABS tablet Take 5 mg by mouth daily.    . metFORMIN (GLUCOPHAGE) 1000 MG tablet Take 2,000 mg by  mouth daily.    . quinapril (ACCUPRIL) 40 MG tablet Take 40 mg by mouth at bedtime.    . raloxifene (EVISTA) 60 MG tablet Take 60 mg by mouth daily.     No current facility-administered medications for this visit.    Functional Status:   In your present state of health, do you have any difficulty performing the following activities: 04/07/2015 12/09/2014  Hearing? N N  Vision? N N  Difficulty concentrating or making decisions? N N  Walking or climbing stairs? N N  Dressing or bathing? N N  Doing errands, shopping? N N    Fall/Depression Screening:    PHQ 2/9 Scores 12/09/2014 04/03/2014  PHQ - 2 Score 0 0    Assessment:   [employer] employee and Link To Wellness member with Type II DM, HTN and hyperlipidemia, currently meeting treatment targets but with recent weight gain due to holidays.  Plan:  [facility] CM Care Plan Problem One        Most Recent Value   Care Plan Problem One  Type II DM meeting A1C target of <7.0% as evidenced by most recent A1C= 6.6% as stated by patient after [place] study was concluded in July 2016   Role Documenting the Problem One  Care Management [place] for Problem One  Active   THN Long Term Goal (31-90 days)  Continued good glycemic control as evidenced  by A1C of <7.0% and return to weight loss or no weight gain at next Link To Wellness visit in March.   THN Long Term Goal Start Date  04/07/15   Interventions for Problem One Long Term Goal  discussed strategies to get back on track, after the holidays, with weight loss, congratulated Amanda Estes on her consistent exercise program, reviewed effects of exercise on glucose control, reviewed DM medications and reviewed mechanism of action, reviewed blood sugar values and pre and post meal targets and strategies to meet targets consistently, encouraged Amanda Estes to make appointments with her primary care provider, eye MD and dentist before the end of 2016, arranged for Link To Wellness follow up in March      RNCM to fax today's office visit note to Dr. Criss Rosales. RNCM will meet quarterly and as needed with patient per Link To Wellness program guidelines to assist with Type II DM, HTN and hyperlipidemia self-management and assess patient's progress toward mutually set goals  Barrington Ellison RN,CCM,CDE [place] Management Coordinator Link To Wellness Office Phone [phone] Office Fax [phone]

## 2015-05-27 DIAGNOSIS — B351 Tinea unguium: Secondary | ICD-10-CM | POA: Diagnosis not present

## 2015-05-27 DIAGNOSIS — L851 Acquired keratosis [keratoderma] palmaris et plantaris: Secondary | ICD-10-CM | POA: Diagnosis not present

## 2015-05-27 DIAGNOSIS — E1142 Type 2 diabetes mellitus with diabetic polyneuropathy: Secondary | ICD-10-CM | POA: Diagnosis not present

## 2015-06-05 ENCOUNTER — Other Ambulatory Visit: Payer: Self-pay | Admitting: Family Medicine

## 2015-06-05 ENCOUNTER — Other Ambulatory Visit (HOSPITAL_COMMUNITY)
Admission: RE | Admit: 2015-06-05 | Discharge: 2015-06-05 | Disposition: A | Discharge status: Home / Self Care | Payer: 59 | Source: Ambulatory Visit | Attending: Family Medicine | Admitting: Family Medicine

## 2015-06-05 DIAGNOSIS — N760 Acute vaginitis: Secondary | ICD-10-CM | POA: Insufficient documentation

## 2015-06-05 DIAGNOSIS — Z113 Encounter for screening for infections with a predominantly sexual mode of transmission: Secondary | ICD-10-CM | POA: Diagnosis not present

## 2015-06-05 DIAGNOSIS — E119 Type 2 diabetes mellitus without complications: Secondary | ICD-10-CM | POA: Diagnosis not present

## 2015-06-05 DIAGNOSIS — Z0001 Encounter for general adult medical examination with abnormal findings: Secondary | ICD-10-CM | POA: Diagnosis not present

## 2015-06-05 DIAGNOSIS — J0100 Acute maxillary sinusitis, unspecified: Secondary | ICD-10-CM | POA: Diagnosis not present

## 2015-06-05 MED FILL — MOMETASONE FUROATE 50 MCG S: 50 | 30 days supply | Qty: 17 | Fill #0

## 2015-06-07 ENCOUNTER — Other Ambulatory Visit (HOSPITAL_COMMUNITY)
Admission: RE | Admit: 2015-06-07 | Discharge: 2015-06-07 | Disposition: A | Discharge status: Home / Self Care | Payer: 59 | Source: Ambulatory Visit | Attending: Internal Medicine | Admitting: Internal Medicine

## 2015-06-07 LAB — BASIC METABOLIC PANEL
Anion gap: 8 (ref 5–15)
BUN: 22 mg/dL — ABNORMAL HIGH (ref 6–20)
CO2: 27 mmol/L (ref 22–32)
Calcium: 9.7 mg/dL (ref 8.9–10.3)
Chloride: 103 mmol/L (ref 101–111)
Creatinine, Ser: 0.91 mg/dL (ref 0.44–1.00)
GFR calc Af Amer: >60 mL/min (ref >60)
GFR calc non Af Amer: >60 mL/min (ref >60)
Glucose, Bld: 105 mg/dL — ABNORMAL HIGH (ref 65–99)
Potassium: 4.6 mmol/L (ref 3.5–5.1)
Sodium: 138 mmol/L (ref 135–145)

## 2015-06-07 LAB — LIPID PANEL
Cholesterol: 153 mg/dL (ref 0–200)
HDL: 47 mg/dL (ref >40)
LDL Cholesterol: 87 mg/dL (ref 0–99)
Total CHOL/HDL Ratio: 3.3 RATIO
Triglycerides: 94 mg/dL (ref <150)
VLDL: 19 mg/dL (ref 0–40)

## 2015-06-09 LAB — HEMOGLOBIN A1C
Hgb A1c MFr Bld: 8.3 % — ABNORMAL HIGH (ref 4.8–5.6)
Mean Plasma Glucose: 192 mg/dL

## 2015-06-10 ENCOUNTER — Other Ambulatory Visit (HOSPITAL_COMMUNITY): Payer: Self-pay | Admitting: Family Medicine

## 2015-06-10 DIAGNOSIS — Z1231 Encounter for screening mammogram for malignant neoplasm of breast: Secondary | ICD-10-CM

## 2015-06-11 LAB — URINE CYTOLOGY ANCILLARY ONLY
Bacterial vaginitis: NEGATIVE
Candida vaginitis: NEGATIVE
Chlamydia: NEGATIVE
Neisseria Gonorrhea: NEGATIVE
Trichomonas: NEGATIVE

## 2015-06-25 ENCOUNTER — Encounter: Payer: 59 | Attending: Family Medicine | Admitting: Nutrition

## 2015-06-25 ENCOUNTER — Other Ambulatory Visit (HOSPITAL_COMMUNITY): Payer: Self-pay | Admitting: Family Medicine

## 2015-06-25 ENCOUNTER — Encounter: Payer: Self-pay | Admitting: Nutrition

## 2015-06-25 VITALS — Ht 61.0 in | Wt 171.0 lb

## 2015-06-25 DIAGNOSIS — Z794 Long term (current) use of insulin: Secondary | ICD-10-CM

## 2015-06-25 DIAGNOSIS — IMO0002 Reserved for concepts with insufficient information to code with codable children: Secondary | ICD-10-CM

## 2015-06-25 DIAGNOSIS — E669 Obesity, unspecified: Secondary | ICD-10-CM | POA: Insufficient documentation

## 2015-06-25 DIAGNOSIS — Z780 Asymptomatic menopausal state: Secondary | ICD-10-CM

## 2015-06-25 DIAGNOSIS — Z6833 Body mass index (BMI) 33.0-33.9, adult: Secondary | ICD-10-CM | POA: Diagnosis not present

## 2015-06-25 DIAGNOSIS — E118 Type 2 diabetes mellitus with unspecified complications: Secondary | ICD-10-CM

## 2015-06-25 DIAGNOSIS — E1165 Type 2 diabetes mellitus with hyperglycemia: Secondary | ICD-10-CM

## 2015-06-25 NOTE — Patient Instructions (Addendum)
GOal 1. Eat 2-3 carb choice per meal.   Keep exercising 5 days per week. 2. Take 1000 mg of Metformin in am and 1000 mg at night. 3. Work with Clinical research associate at [employer] for better work out. 4. Take Metformin after breakfast.

## 2015-06-25 NOTE — Progress Notes (Signed)
  Medical Nutrition Therapy:  Appt start time:1200 end time:  1215.  Assessment:  Primary concerns today: Diabetes and weight loss. Lost 4 lbs since last visit.  FBS: 109 mg/dl. PE:[id number] mg/dl Metformin 2000 mg a day, Trajenta,  Invokana, 22 units of Lantus and 4 unis of Humalog with meals. SHe was taking 2000 mg of her Metformin at night instead of 1000 mg in am and 1000 mg in PM. Due to protection of kidneys, advised her to take 1000 mg after breakfast and 1000 mg at night. Exercising 2 hours most days of the week. BS are improving. A1C should be better at her next visit.   Eating bettter balanced carbs and more fresh fruits and vegetables. Lab Results  Component Value Date   HGBA1C 8.3* 06/07/2015    Preferred Learning Style:   Reading  Hands on   Learning Readiness:  Ready  Change in progress   MEDICATIONS See list  DIETARY INTAKE:  24-hr recall:  Eating 3 balanced meals now. Drinking lots of water. FitBit keeps her on task of drinking water, moving and make sure she is eating on time.  Usual physical activity: Trying to get in 10,000 steps now daily; going to the [facility] and  5 days a week.  Estimated energy needs: 1200 calories 135 g carbohydrates 90 g protein 33 g fat  Progress Towards Goal(s):  In progress.   Nutritional Diagnosis:  NB-1.1 Food and nutrition-related knowledge deficit As related to diabetes.  As evidenced by A1C of 6.4% but was >9% at one time..    Intervention:  Meal planning, CHO counting, benefits of exercise on insulin resisitance.  Goal 1. Eat 2-3 carb choices per meal.   Keep exercising 5 days per week. 2. Take 1000 mg of Metformin in am and 1000 mg at night. 3. Work with Clinical research associate at [employer] for better work out. 4. Take Metformin after breakfast. 5.Lose 2 lbs per month.   Teaching Method Utilized:  Visual Auditory Hands on  Handouts given during visit include:  Carb Counting and Food Label handouts Meal Plan Card  The Plate  Method  Barriers to learning/adherence to lifestyle change: none  Demonstrated degree of understanding via:  Teach Back   Monitoring/Evaluation:  Dietary intake, exercise, meal planning, SBG and body weight in PRN.

## 2015-06-30 ENCOUNTER — Ambulatory Visit (HOSPITAL_COMMUNITY)
Admission: RE | Admit: 2015-06-30 | Discharge: 2015-06-30 | Disposition: A | Discharge status: Home / Self Care | Payer: 59 | Source: Ambulatory Visit | Attending: Family Medicine | Admitting: Family Medicine

## 2015-06-30 DIAGNOSIS — Z1231 Encounter for screening mammogram for malignant neoplasm of breast: Secondary | ICD-10-CM | POA: Diagnosis not present

## 2015-06-30 DIAGNOSIS — Z780 Asymptomatic menopausal state: Secondary | ICD-10-CM

## 2015-06-30 DIAGNOSIS — N951 Menopausal and female climacteric states: Secondary | ICD-10-CM | POA: Diagnosis present

## 2015-07-01 MED FILL — TRADJENTA 5 MG TABLET: 5 | 90 days supply | Qty: 90 | Fill #0

## 2015-07-09 ENCOUNTER — Encounter: Payer: Self-pay | Admitting: *Deleted

## 2015-07-09 ENCOUNTER — Other Ambulatory Visit: Payer: Self-pay | Admitting: *Deleted

## 2015-07-09 NOTE — Patient Outreach (Signed)
[facility]) Care Management   07/09/2015  Amanda Estes 02-22-1946 [id number]  Amanda Estes is an 70 y.o. female who presents to the [place] Management office for routine Link To Wellness follow up for self management assistance with Type II DM, HTN and hyperlipidemia.  Subjective:  Amanda Estes continues to participate in the [facility] trial for management  of Type II DM. She continues to exercise daily and wears an activity tracker for the trial that measures her steps. She meets the goal for daily steps most times. She reports that her estimated Hgb A1C based on her blood sugar readings via the [facility] trial is 6.72%. She says she saw Dr. Criss Estes last month for her complete physical.  Objective:   Review of Systems  Constitutional: Negative.     Physical Exam  Constitutional: She is oriented to person, place, and time. She appears well-developed and well-nourished.  Respiratory: Effort normal.  Neurological: She is alert and oriented to person, place, and time.  Skin: Skin is warm and dry.  Psychiatric: She has a normal mood and affect. Her behavior is normal. Judgment and thought content normal.   Filed Weights   07/09/15 0929  Weight: 169 lb 1.6 oz (76.703 kg)   Filed Vitals:   07/09/15 0929  BP: 110/60    Current Medications:   Current Outpatient Prescriptions  Medication Sig Dispense Refill  . Canagliflozin (INVOKANA) 300 MG TABS Take 300 mg by mouth daily.    Marland Kitchen ezetimibe (ZETIA) 10 MG tablet Take 10 mg by mouth daily.    . hydrochlorothiazide (HYDRODIURIL) 25 MG tablet Take 25 mg by mouth daily.    . insulin glargine (LANTUS) 100 UNIT/ML injection Inject 46 Units into the skin at bedtime.     Marland Kitchen linagliptin (TRADJENTA) 5 MG TABS tablet Take 5 mg by mouth daily.    . metFORMIN (GLUCOPHAGE) 1000 MG tablet Take 2,000 mg by mouth daily.    . mometasone (NASONEX) 50 MCG/ACT nasal spray Place 2 sprays into the nose  daily.    . raloxifene (EVISTA) 60 MG tablet Take 60 mg by mouth daily.    . valsartan (DIOVAN) 40 MG tablet Take 40 mg by mouth daily.    . clindamycin (CLEOCIN) 300 MG capsule Take 300 mg by mouth 3 (three) times daily. Reported on 07/09/2015    . insulin aspart (NOVOLOG) 100 UNIT/ML injection Inject into the skin 3 (three) times daily before meals. Reported on 07/09/2015    . quinapril (ACCUPRIL) 40 MG tablet Take 40 mg by mouth at bedtime. Reported on 07/09/2015     No current facility-administered medications for this visit.    Functional Status:   In your present state of health, do you have any difficulty performing the following activities: 07/09/2015 04/07/2015  Hearing? N N  Vision? N N  Difficulty concentrating or making decisions? N N  Walking or climbing stairs? N N  Dressing or bathing? N N  Doing errands, shopping? N N    Fall/Depression Screening:    PHQ 2/9 Scores 06/25/2015 12/09/2014 04/03/2014  PHQ - 2 Score 0 0 0    Assessment:   [employer] employee and Link To Wellness member with Type II DM, HTN and hyperlipidemia currently enrolled in [place] trial with estimated Hgb A1C= 6.72% per blood sugars recorded in [place] reflecting improvement in self management as Hgb A1C= 8.3% on 06/07/15 per trial initial lab data.  Plan:  [facility] CM Care Plan Problem  One        Most Recent Value   Care Plan Problem One  Type II DM no longer meeting A1C target of <7.0% as evidenced by most recent Hgb A1C= 8.3% on 06/07/15, also with HTN and hyperlipidemia and meeting treatment targets as evidenced by normal lipid profile on 06/07/15 and BP readings consistently <140/<90    Role Documenting the Problem One  Care Management [place] for Problem One  Active   THN Long Term Goal (31-90 days)  Improved glycemic control as evidenced by A1C of <7.0% and evidence of  weight loss or no weight gain at next Link To Wellness visit in July   THN Long Term Goal Start Date  07/09/15    Interventions for Problem One Long Term Goal  Congratulated Amanda Estes on remaining an active participant in the [place] trial, reviewed blood sugar readings, activity goals and weights from [employer], congratulated Amanda Estes on her consistent exercise program, reviewed effects of exercise on glucose control, reviewed DM medications and reviewed mechanism of action, reviewed blood sugar values and pre and post meal targets and strategies to meet targets consistently,  arranged for Link To Wellness follow up in July     RNCM to fax today's office visit note to Dr. Criss Estes. RNCM will meet quarterly and as needed with patient per Link To Wellness program guidelines to assist with Type II DM, HTN and hyperlipidemia self-management and assess patient's progress toward mutually set goals.  Barrington Ellison RN,CCM,CDE [place] Management Coordinator Link To Wellness Office Phone [phone] Office Fax [phone]

## 2015-07-17 MED FILL — HYDROCHLOROTHIAZIDE 25 MG T: 25 | 90 days supply | Qty: 90 | Fill #3 | Status: TO

## 2015-07-17 MED FILL — NOVOFINE 30 NEEDLES: 30G X 8 MM | 90 days supply | Qty: 100 | Fill #3

## 2015-07-17 MED FILL — LANTUS SOLOSTAR 100 UNITS/M: 100 | 78 days supply | Qty: 30 | Fill #1

## 2015-07-17 MED FILL — VALSARTAN 160 MG TABLET: 160 | 90 days supply | Qty: 90 | Fill #1

## 2015-07-17 MED FILL — EZETIMIBE 10 MG TABLET: 10 | 90 days supply | Qty: 90 | Fill #1

## 2015-07-17 MED FILL — INVOKANA 300 MG TABLET: 300 | 90 days supply | Qty: 90 | Fill #3

## 2015-08-05 MED FILL — metFORMIN HCL 1000 MG TABS: 1000 | 90 days supply | Qty: 180 | Fill #1

## 2015-08-12 DIAGNOSIS — E1142 Type 2 diabetes mellitus with diabetic polyneuropathy: Secondary | ICD-10-CM | POA: Diagnosis not present

## 2015-08-12 DIAGNOSIS — L851 Acquired keratosis [keratoderma] palmaris et plantaris: Secondary | ICD-10-CM | POA: Diagnosis not present

## 2015-08-12 DIAGNOSIS — B351 Tinea unguium: Secondary | ICD-10-CM | POA: Diagnosis not present

## 2015-08-25 MED FILL — RALOXIFENE HCL 60 MG TABLET: 60 | 90 days supply | Qty: 90 | Fill #0

## 2015-09-22 DIAGNOSIS — E113291 Type 2 diabetes mellitus with mild nonproliferative diabetic retinopathy without macular edema, right eye: Secondary | ICD-10-CM | POA: Diagnosis not present

## 2015-09-22 DIAGNOSIS — H5212 Myopia, left eye: Secondary | ICD-10-CM | POA: Diagnosis not present

## 2015-09-22 DIAGNOSIS — Z961 Presence of intraocular lens: Secondary | ICD-10-CM | POA: Diagnosis not present

## 2015-09-30 MED FILL — TRADJENTA 5 MG TABLET: 5 | 90 days supply | Qty: 90 | Fill #1

## 2015-10-16 MED FILL — INVOKANA 300 MG TABLET: 300 | 90 days supply | Qty: 90 | Fill #4

## 2015-10-16 MED FILL — LANTUS SOLOSTAR 100 UNITS/M: 100 | 78 days supply | Qty: 30 | Fill #2

## 2015-10-16 MED FILL — VALSARTAN 160 MG TABLET: 160 | 90 days supply | Qty: 90 | Fill #0

## 2015-10-16 MED FILL — NOVOFINE 30 NEEDLES: 30G X 8 MM | 90 days supply | Qty: 100 | Fill #4

## 2015-10-16 MED FILL — EZETIMIBE 10 MG TABLET: 10 | 90 days supply | Qty: 90 | Fill #0

## 2015-10-29 MED FILL — metFORMIN HCL 1000 MG TABS: 1000 | 90 days supply | Qty: 180 | Fill #2

## 2015-11-05 MED FILL — RALOXIFENE HCL 60 MG TABLET: 60 | 60 days supply | Qty: 60 | Fill #1

## 2015-11-12 ENCOUNTER — Other Ambulatory Visit: Payer: Self-pay | Admitting: *Deleted

## 2015-11-12 ENCOUNTER — Encounter: Payer: Self-pay | Admitting: *Deleted

## 2015-11-12 NOTE — Patient Outreach (Addendum)
[facility]) Care Management   11/12/2015  Amanda Estes 1945-07-07 [id number]  Amanda Estes is an 70 y.o. female who presents to the [place] Management office for routine Link To Wellness follow up for self management assistance with Type II DM, HTN and hyperlipidemia.  Subjective: [name] says she completed the [facility] trial for management of Type II DM on July 8th. She says she wishes to participate in the [facility] but was unable to open the link that was sent to her.  She continues to exercise daily, works with the [employer] twice weekly in [place]  and wears an activity tracker for the trial that measures her steps. She meets the goal for daily steps most times. She says her fasting blood sugars are meeting target 100%.  Objective:   Review of Systems  Constitutional: Negative.     Physical Exam  Constitutional: She is oriented to person, place, and time. She appears well-developed and well-nourished.  Respiratory: Effort normal.  Neurological: She is alert and oriented to person, place, and time.  Skin: Skin is warm and dry.  Psychiatric: She has a normal mood and affect. Her behavior is normal. Judgment and thought content normal.   Filed Vitals:   11/12/15 0926  BP: 100/55   Filed Weights   11/12/15 0926  Weight: 168 lb (76.204 kg)    Encounter Medications:   Outpatient Encounter Prescriptions as of 11/12/2015  Medication Sig Note  . Canagliflozin (INVOKANA) 300 MG TABS Take 300 mg by mouth daily.   Marland Kitchen ezetimibe (ZETIA) 10 MG tablet Take 10 mg by mouth daily.   . hydrochlorothiazide (HYDRODIURIL) 25 MG tablet Take 25 mg by mouth daily.   . insulin glargine (LANTUS) 100 UNIT/ML injection Inject 46 Units into the skin at bedtime.  05/09/2014: 22 units of Lantus daily; down from 46   . linagliptin (TRADJENTA) 5 MG TABS tablet Take 5 mg by mouth daily.   . metFORMIN (GLUCOPHAGE)  1000 MG tablet Take 2,000 mg by mouth daily. 07/09/2015: Takes 1000 mg twice daily  . mometasone (NASONEX) 50 MCG/ACT nasal spray Place 2 sprays into the nose daily. 11/12/2015: Takes as needed  . raloxifene (EVISTA) 60 MG tablet Take 60 mg by mouth daily.   . valsartan (DIOVAN) 40 MG tablet Take 40 mg by mouth daily.   . clindamycin (CLEOCIN) 300 MG capsule Take 300 mg by mouth 3 (three) times daily. Reported on 11/12/2015   . insulin aspart (NOVOLOG) 100 UNIT/ML injection Inject into the skin 3 (three) times daily before meals. Reported on 11/12/2015 04/03/2014: 4 units plus sliding scale  . quinapril (ACCUPRIL) 40 MG tablet Take 40 mg by mouth at bedtime. Reported on 11/12/2015 08/12/2014: Take in morning   No facility-administered encounter medications on file as of 11/12/2015.    Functional Status:   In your present state of health, do you have any difficulty performing the following activities: 07/09/2015 04/07/2015  Hearing? N N  Vision? N N  Difficulty concentrating or making decisions? N N  Walking or climbing stairs? N N  Dressing or bathing? N N  Doing errands, shopping? N N    Fall/Depression Screening:    PHQ 2/9 Scores 06/25/2015 12/09/2014 04/03/2014  PHQ - 2 Score 0 0 0    Assessment: [employer] employee and Link To Wellness member with Type II DM, HTN and hyperlipidemia recently completed the [facility] trial with estimated Hgb A1C= 6.21% per blood sugars  recorded in [facility] reflecting improvement in self management as Hgb A1C= 8.3% on 06/07/15 per trial initial lab data. Also with 8 lbs weight loss during trial.   Plan:  [facility] CM Care Plan Problem One        Most Recent Value   Care Plan Problem One  Link To Wellness member with Type II DM, HTN, hyperlipidemia and obesity meeting treatment targets as evidenced by 100% of self monitored CBGs meeting target, normal lipid profile on 06/07/15 and BP readings consistently <140/<90    Role Documenting the Problem One  Care Management  [place] for Problem One  Active   THN Long Term Goal (31-90 days)  Ongoing good control of chronic disease states as evidenced by meeting treatment targets for Type II DM, HTN and hyperlipidemia as evidenced by Hgb A1C <7.0%, BP readings consistently <140/<90 , normal lipid profile and evidence of weight loss or no weight gain at each assessment   THN Long Term Goal Start Date  11/12/15   Interventions for Problem One Long Term Goal  reviewed M[place]'s [facility] data related to weight, CBGs, activity and medication adherence, congratulated her on excellent self management skills, discussed her interest in participating in the [employer] program and successfully notified [employer] team via e-mail, [employer] team  to contact her, will arrange for Link To Wellness follow up based on Hgb A1C results that will be done by [facility] for end of trial data      RNCM to fax today's office visit note to Dr. Criss Rosales. RNCM will meet quarterly and as needed with patient per Link To Wellness program guidelines to assist with Type II DM, HTN, hyperlipidemia and obesity self-management and assess patient's progress toward mutually set goals.   Barrington Ellison RN,CCM,CDE [place] Management Coordinator Link To Wellness Office Phone [phone] Office Fax [phone]

## 2015-11-21 DIAGNOSIS — B351 Tinea unguium: Secondary | ICD-10-CM | POA: Diagnosis not present

## 2015-11-21 DIAGNOSIS — E1142 Type 2 diabetes mellitus with diabetic polyneuropathy: Secondary | ICD-10-CM | POA: Diagnosis not present

## 2015-11-21 DIAGNOSIS — L851 Acquired keratosis [keratoderma] palmaris et plantaris: Secondary | ICD-10-CM | POA: Diagnosis not present

## 2015-12-05 DIAGNOSIS — M130 Polyarthritis, unspecified: Secondary | ICD-10-CM | POA: Diagnosis not present

## 2015-12-05 DIAGNOSIS — E782 Mixed hyperlipidemia: Secondary | ICD-10-CM | POA: Diagnosis not present

## 2015-12-05 DIAGNOSIS — E6609 Other obesity due to excess calories: Secondary | ICD-10-CM | POA: Diagnosis not present

## 2015-12-05 DIAGNOSIS — I10 Essential (primary) hypertension: Secondary | ICD-10-CM | POA: Diagnosis not present

## 2015-12-08 ENCOUNTER — Other Ambulatory Visit (HOSPITAL_COMMUNITY): Payer: Self-pay | Admitting: Family Medicine

## 2015-12-08 ENCOUNTER — Ambulatory Visit (HOSPITAL_COMMUNITY)
Admission: RE | Admit: 2015-12-08 | Discharge: 2015-12-08 | Disposition: A | Discharge status: Home / Self Care | Payer: 59 | Source: Ambulatory Visit | Attending: Family Medicine | Admitting: Family Medicine

## 2015-12-08 DIAGNOSIS — M130 Polyarthritis, unspecified: Secondary | ICD-10-CM

## 2015-12-08 DIAGNOSIS — M19011 Primary osteoarthritis, right shoulder: Secondary | ICD-10-CM | POA: Diagnosis not present

## 2015-12-29 MED FILL — TRADJENTA 5 MG TABLET: 5 | 90 days supply | Qty: 90 | Fill #0

## 2015-12-30 MED FILL — RALOXIFENE HCL 60 MG TABLET: 60 | 90 days supply | Qty: 90 | Fill #0

## 2015-12-31 ENCOUNTER — Ambulatory Visit (HOSPITAL_COMMUNITY): Payer: 59 | Attending: Family Medicine | Admitting: Occupational Therapy

## 2015-12-31 DIAGNOSIS — M25511 Pain in right shoulder: Secondary | ICD-10-CM | POA: Insufficient documentation

## 2015-12-31 DIAGNOSIS — R29898 Other symptoms and signs involving the musculoskeletal system: Secondary | ICD-10-CM | POA: Diagnosis present

## 2015-12-31 NOTE — Therapy (Addendum)
[place], [place], [address] Phone: [phone]   Fax:  [phone]  Occupational Therapy Evaluation  Patient Details  Name: Amanda Estes MRN: [id number] Date of Birth: 1946/02/10 Referring Provider: Dr. Lucianne Lei  Encounter Date: 12/31/2015      OT End of Session - 12/31/15 0937    Visit Number 1   Number of Visits 2   Authorization Type UMR   OT Start Time 0858   OT Stop Time 0933   OT Time Calculation (min) 35 min   Activity Tolerance Patient tolerated treatment well   Behavior During Therapy [facility] for tasks assessed/performed      Past Medical History:  Diagnosis Date  . Diabetes mellitus   . Hyperlipidemia   . Obesity     Past Surgical History:  Procedure Laterality Date  . CATARACT EXTRACTION Right   . CATARACT EXTRACTION W/PHACO Left 04/04/2014   Procedure: CATARACT EXTRACTION PHACO AND INTRAOCULAR LENS PLACEMENT LEFT EYE;  Surgeon: Tonny Branch, MD;  Location: [facility];  Service: Ophthalmology;  Laterality: Left;  CDE: 13.36  . MYOMECTOMY  2005    There were no vitals filed for this visit.      Subjective Assessment - 12/31/15 0938    Subjective  S: It only hurts occasionally.    Pertinent History Pt is a 70 y/o female presenting with occasional right shoulder pain that began approximately 6 months ago after a fall. Pt reports the pain only occurs with certain movements and stops when movement ceases. When pain occurs it is only a 1 or 2 on a scale of 1 to 10. Dr. Lucianne Lei referred pt to occupational therapy for evaluation and treatment.    Special Tests FOTO Score: 77/100 (23% impairment)   Patient Stated Goals To improve pain with movement.   Currently in Pain? No/denies           [facility] OT Assessment - 12/31/15 0854      Assessment   Diagnosis Right shoulder pain   Referring Provider Dr. Lucianne Lei   Onset Date 07/01/15  approximately 6 months ago   Prior Therapy None     Precautions   Precautions None     Restrictions   Weight Bearing Restrictions No     Balance Screen   Has the patient fallen in the past 6 months Yes   How many times? 1   Has the patient had a decrease in activity level because of a fear of falling?  No   Is the patient reluctant to leave their home because of a fear of falling?  No     Home  Environment   Family/patient expects to be discharged to: Private residence     Prior Function   Level of Independence Independent   Vocation Part time employment   Vocation Requirements occasionally moving boxes   Leisure traveling     ADL   ADL comments Pt has difficulty with reaching up and diagonally, pain with occasional lifting tasks-weights at the gym, reaching behind back     Written Expression   Dominant Hand Right     Cognition   Overall Cognitive Status Within Functional Limits for tasks assessed     ROM / Strength   AROM / PROM / Strength Strength;PROM;AROM     Palpation   Palpation comment Moderate fascial restrictions in right upper arm, trapezius, and scapularis regions     AROM   Overall AROM Comments Assessed  seated, ER/IR adducted   AROM Assessment Site Shoulder   Right/Left Shoulder Right   Right Shoulder Flexion 125 Degrees   Right Shoulder ABduction 105 Degrees   Right Shoulder Internal Rotation 90 Degrees   Right Shoulder External Rotation 41 Degrees     PROM   Overall PROM Comments Assessed supine, ER/IR adducted   PROM Assessment Site Shoulder   Right/Left Shoulder Right   Right Shoulder Flexion 140 Degrees   Right Shoulder ABduction 150 Degrees   Right Shoulder Internal Rotation 90 Degrees   Right Shoulder External Rotation 60 Degrees     Strength   Overall Strength Comments Assessed seated, ER/IR adducted   Strength Assessment Site Shoulder   Right/Left Shoulder Right   Right Shoulder Flexion 5/5   Right Shoulder ABduction 5/5   Right Shoulder Internal Rotation 5/5   Right Shoulder External  Rotation 5/5                         OT Education - 12/31/15 0937    Education provided Yes   Education Details A/ROM and shoulder stretches   Person(s) Educated Patient   Methods Explanation;Demonstration;Handout   Comprehension Verbalized understanding;Returned demonstration          OT Short Term Goals - 12/31/15 0944      OT SHORT TERM GOAL #1   Title Pt will be provided with and educated on HEP.    Time 4   Period Weeks   Status New     OT SHORT TERM GOAL #2   Title Pt will decrease pain in RUE to 1/10 or no pain to improve ability to complete difficult functional reaching tasks.    Time 4   Period Weeks   Status New     OT SHORT TERM GOAL #3   Title Pt will return to prior level of functioning and independence in ADL and work tasks.    Time 4   Period Weeks   Status New                  Plan - 12/31/15 0940    Clinical Impression Statement A: Pt is a 70 y/o female presenting with increased pain and fascial restrictions in RUE limiting certain functional movements of RUE. Pt's RUE ROM is equivalent to LUE and strength is WNL. Due to pt functional status with ADL and work tasks, pt provided with HEP including shoulder stretches and A/ROM for the RUE. Pt will return in approximately 4 weeks to determine if visits are warranted or if updated HEP will be appropriate. Pt agreeable to plan.    Rehab Potential Good   OT Frequency Monthly   OT Duration 4 weeks   OT Treatment/Interventions Self-care/ADL training;Therapeutic exercise;Patient/family education;Manual Therapy;Therapeutic activities;Cryotherapy;Electrical Stimulation;Moist Heat;Passive range of motion   Plan P: Pt will benefit from skilled OT services to improve functional use of RUE while decreasing pain. Treatment plan: HEP including shoulder stretches and A/ROM, reassess in 1 month (4 weeks) to determine if therapy visits are warranted or if updated HEP is appropriate.    OT Home  Exercise Plan shoulder stretches, A/ROM exercises   Consulted and Agree with Plan of Care Patient      Patient will benefit from skilled therapeutic intervention in order to improve the following deficits and impairments:  Impaired flexibility, Pain, Impaired UE functional use, Increased fascial restricitons  Visit Diagnosis: Pain in right shoulder  Other symptoms and signs involving the musculoskeletal system  G-Codes: Carrying, Moving, Handling Objects Current status: At least 20 percent but less than 40 percent impaired, limited, or restricted.  Goal status: At least 1 percent but less than 20 percent impaired, limited, or restricted.    Problem List Patient Active Problem List   Diagnosis Date Noted  . Diabetes [facility]) 03/05/2013    Guadelupe Sabin, OTR/L  [phone] 12/31/2015, 9:47 AM  [facility] [address] [place], [place], [address] Phone: [phone]   Fax:  [phone]  Name: Amanda Estes MRN: [id number] Date of Birth: [birth year]    OCCUPATIONAL THERAPY DISCHARGE SUMMARY  Visits from Start of Care: 1  Current functional level related to goals / functional outcomes: Unknown. Pt did not return for second/final therapy visit.      Education / Equipment:  See above.  Plan: Patient agrees to discharge.  Patient goals were not met. Patient is being discharged due to not returning since the last visit.  ?????

## 2015-12-31 NOTE — Patient Instructions (Signed)
Shoulder Stretches  1) Flexion Wall Stretch    Face wall, place affected handon wall in front of you. Slide hand up the wall  and lean body in towards the wall. Hold for 5 seconds. Repeat 5 times. 1-2 times/day.     2) Towel Stretch with Internal Rotation   Gently pull up your affected arm  behind your back with the assist of a towel. Hold for 5 seconds. Repeat 5X, 1-2x/day.             3) Corner Stretch    Stand at a corner of a wall, place your arms on the walls with elbows bent. Lean into the corner until a stretch is felt along the front of your chest and/or shoulders. Hold for 5 seconds. Repeat 5X, 1-2 times/day.    4) Posterior Capsule Stretch    Bring the involved arm across chest. Grasp elbow and pull toward chest until you feel a stretch in the back of the upper arm and shoulder. Hold 5 seconds. Repeat 5X. Complete 1-2 times/day.    5) Scapular Retraction    Tuck chin back as you pinch shoulder blades together.  Hold 5 seconds. Repeat 5X. Complete 1-2 times/day.    6) External Rotation Stretch:     Place your affected hand on the wall with the elbow bent and gently turn your body the opposite direction until a stretch is felt.  Hold 5 seconds. Repeat 5X, 1-2X/day.       Shoulder A/ROM exercises: Repeat all exercises 10-15 times, 1-2 times per day. 1) Shoulder Protraction    Begin with elbows by your side, slowly "punch" straight out in front of you keeping arms/elbows straight.      2) Shoulder Flexion  Supine:     Standing:         Begin with arms at your side with thumbs pointed up, slowly raise both arms up and forward towards overhead.               3) Horizontal abduction/adduction  Supine:   Standing:           Begin with arms straight out in front of you, bring out to the side in at "T" shape. Keep arms straight entire time.                 4) Internal & External Rotation    *No band*  -Stand with elbows at the side and elbows bent 90 degrees. Move your forearms away from your body, then bring back inward toward the body.     5) Shoulder Abduction  Supine:     Standing:       Lying on your back begin with your arms flat on the table next to your side. Slowly move your arms out to the side so that they go overhead, in a jumping jack or snow angel movement.

## 2016-01-21 ENCOUNTER — Other Ambulatory Visit: Payer: Self-pay | Admitting: *Deleted

## 2016-01-21 ENCOUNTER — Encounter: Payer: Self-pay | Admitting: *Deleted

## 2016-01-21 MED FILL — EZETIMIBE 10 MG TABLET: 10 | 90 days supply | Qty: 90 | Fill #1

## 2016-01-21 MED FILL — metFORMIN HCL 1000 MG TABS: 1000 | 90 days supply | Qty: 180 | Fill #3

## 2016-01-21 NOTE — Patient Outreach (Signed)
Amanda Estes) Care Management   01/21/2016  Amanda Estes 08-26-1945 [id number]  Amanda Estes is an 70 y.o. female who presents to the [place] Management office for routine Link To Wellness follow up for self management assistance with Type II DM, HTN, hyperlipidemia and obesity.  Subjective:  Amanda Estes says she is doing well but is frustrated that she has gained weight, she says her clothes are not ill fitting and wonders if she had built muscle mass to replace the fat. She continues to exercise regularly and is tracking her steps on an activity tracker. She checks her blood sugar daily, post breakfast and reports most readings are <150.  Objective:   Review of Systems  Constitutional: Negative.     Physical Exam  Constitutional: She is oriented to person, place, and time. She appears well-developed and well-nourished.  Respiratory: Effort normal.  Neurological: She is alert and oriented to person, place, and time.  Skin: Skin is warm and dry.  Psychiatric: She has a normal mood and affect. Her behavior is normal. Judgment and thought content normal.   Vitals:   01/21/16 1005  BP: 110/65   Filed Weights   01/21/16 1005  Weight: 180 lb (81.6 kg)    Encounter Medications:   Outpatient Encounter Prescriptions as of 01/21/2016  Medication Sig Note  . Canagliflozin (INVOKANA) 300 MG TABS Take 300 mg by mouth daily.   Marland Kitchen ezetimibe (ZETIA) 10 MG tablet Take 10 mg by mouth daily.   . hydrochlorothiazide (HYDRODIURIL) 25 MG tablet Take 25 mg by mouth daily.   . insulin glargine (LANTUS) 100 UNIT/ML injection Inject 46 Units into the skin at bedtime.  05/09/2014: 22 units of Lantus daily; down from 46   . linagliptin (TRADJENTA) 5 MG TABS tablet Take 5 mg by mouth daily.   . metFORMIN (GLUCOPHAGE) 1000 MG tablet Take 2,000 mg by mouth daily. 07/09/2015: Takes 1000 mg twice daily  . mometasone (NASONEX) 50 MCG/ACT nasal spray Place 2  sprays into the nose daily. 11/12/2015: Takes as needed  . raloxifene (EVISTA) 60 MG tablet Take 60 mg by mouth daily.   . valsartan (DIOVAN) 40 MG tablet Take 40 mg by mouth daily.   . clindamycin (CLEOCIN) 300 MG capsule Take 300 mg by mouth 3 (three) times daily. Reported on 11/12/2015   . insulin aspart (NOVOLOG) 100 UNIT/ML injection Inject into the skin 3 (three) times daily before meals. Reported on 11/12/2015 04/03/2014: 4 units plus sliding scale  . quinapril (ACCUPRIL) 40 MG tablet Take 40 mg by mouth at bedtime. Reported on 11/12/2015 08/12/2014: Take in morning   No facility-administered encounter medications on file as of 01/21/2016.     Functional Status:   In your present state of health, do you have any difficulty performing the following activities: 01/21/2016 11/12/2015  Hearing? N N  Vision? N N  Difficulty concentrating or making decisions? N N  Walking or climbing stairs? N N  Dressing or bathing? N N  Doing errands, shopping? N N  Preparing Food and eating ? N N  Using the Toilet? N N  In the past six months, have you accidently leaked urine? N N  Do you have problems with loss of bowel control? N N  Managing your Medications? N N  Managing your Finances? N N  Housekeeping or managing your Housekeeping? 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  Plan:  [facility] CM Care Plan Problem One   Flowsheet Row Most Recent Value  Care Plan Problem One Link To Wellness member with Type II DM, HTN, hyperlipidemia and obesity meeting treatment targets for HTN and lipids as evidenced by normal lipid profile on 06/07/15 and BP readings consistently <140/<90, not meeting treatment Hgb A1C target as most recent one was 7.6% in August 2017   Role Documenting  the Problem One Care Management [name] for Problem One Active  THN Long Term Goal (31-90 days) Good control of chronic disease states as evidenced by meeting treatment targets for Type II DM, HTN and hyperlipidemia as evidenced by Hgb A1C <7.0%, BP readings consistently <140/<90 , normal lipid profile and evidence of weight loss or no weight gain at each assessment  THN Long Term Goal Start Date 01/21/16  Interventions for Problem One Long Term Goal Reviewed DM self managament skills, discussed concerns about weight gain, encouraged [name] to continue her consistent exercise program,discussed changes to [employer] program for 2018, arranged for Link To Wellness follow up in December      RNCM to fax today's office visit note to Dr. Criss Rosales. RNCM will meet quarterly and as needed with patient per Link To Wellness program guidelines to assist with Type II DM, HTN, hyperlipidemia and obesity self-management and assess patient's progress toward mutually set goals.  Barrington Ellison RN,CCM,CDE [place] Management Coordinator Link To Wellness Office Phone [phone] Office Fax [phone]

## 2016-01-26 MED FILL — VALSARTAN 160 MG TABLET: 160 | 90 days supply | Qty: 90 | Fill #1

## 2016-01-26 MED FILL — HYDROCHLOROTHIAZIDE 25 MG T: 25 | 90 days supply | Qty: 90 | Fill #0 | Status: TO

## 2016-01-27 ENCOUNTER — Telehealth (HOSPITAL_COMMUNITY): Payer: Self-pay

## 2016-01-27 NOTE — Telephone Encounter (Signed)
01/27/16 pt cx and said she would have to call back to rescheudle.

## 2016-01-28 ENCOUNTER — Ambulatory Visit (HOSPITAL_COMMUNITY): Payer: 59 | Admitting: Occupational Therapy

## 2016-02-10 MED FILL — INVOKANA 300 MG TABLET: 300 | 90 days supply | Qty: 90 | Fill #0

## 2016-03-30 DIAGNOSIS — E1142 Type 2 diabetes mellitus with diabetic polyneuropathy: Secondary | ICD-10-CM | POA: Diagnosis not present

## 2016-03-30 DIAGNOSIS — L851 Acquired keratosis [keratoderma] palmaris et plantaris: Secondary | ICD-10-CM | POA: Diagnosis not present

## 2016-03-30 DIAGNOSIS — B351 Tinea unguium: Secondary | ICD-10-CM | POA: Diagnosis not present

## 2016-04-02 MED FILL — TRADJENTA 5 MG TABLET: 5 | 90 days supply | Qty: 90 | Fill #1

## 2016-04-02 MED FILL — RALOXIFENE HCL 60 MG TABLET: 60 | 90 days supply | Qty: 90 | Fill #1

## 2016-04-05 MED FILL — LANTUS SOLOSTAR 100 UNITS/M: 100 | 78 days supply | Qty: 30 | Fill #3

## 2016-04-06 MED FILL — NOVOFINE 30 NEEDLES: 30G X 8 MM | 90 days supply | Qty: 100 | Fill #0

## 2016-04-14 ENCOUNTER — Encounter: Payer: Self-pay | Admitting: *Deleted

## 2016-04-14 ENCOUNTER — Other Ambulatory Visit: Payer: Self-pay | Admitting: *Deleted

## 2016-04-14 NOTE — Patient Outreach (Signed)
[facility]) Care Management   04/14/2016  MADASYN HEATH 11-12-1945 [id number]  Amanda Estes is an 70 y.o. female who presents to the [place] Management office for routine Link To Wellness follow up for self management assistance with Type II DM, HTN, hyperlipidemia and obesity.  Subjective:  Jeliyah says she is doing well, voices no complaints.  She continues to exercise regularly and is tracking her steps on an activity tracker. She checks her blood sugar daily, post breakfast and reports most readings are <150.  Objective:   Review of Systems  Constitutional: Negative.     Physical Exam  Constitutional: She is oriented to person, place, and time. She appears well-developed and well-nourished.  Respiratory: Effort normal.  Neurological: She is alert and oriented to person, place, and time.  Skin: Skin is warm and dry.  Psychiatric: She has a normal mood and affect. Her behavior is normal. Judgment and thought content normal.   Vitals:   04/14/16 0917  BP: 110/70   Filed Weights   04/14/16 0917  Weight: 173 lb (78.5 kg)    Encounter Medications:   Outpatient Encounter Prescriptions as of 04/14/2016  Medication Sig Note  . Canagliflozin (INVOKANA) 300 MG TABS Take 300 mg by mouth daily.   Marland Kitchen ezetimibe (ZETIA) 10 MG tablet Take 10 mg by mouth daily.   . hydrochlorothiazide (HYDRODIURIL) 25 MG tablet Take 25 mg by mouth daily.   . insulin glargine (LANTUS) 100 UNIT/ML injection Inject 46 Units into the skin at bedtime.  05/09/2014: 22 units of Lantus daily; down from 46   . metFORMIN (GLUCOPHAGE) 1000 MG tablet Take 2,000 mg by mouth daily. 07/09/2015: Takes 1000 mg twice daily  . mometasone (NASONEX) 50 MCG/ACT nasal spray Place 2 sprays into the nose daily. 11/12/2015: Takes as needed  . raloxifene (EVISTA) 60 MG tablet Take 60 mg by mouth daily.   . valsartan (DIOVAN) 40 MG tablet Take 40 mg by mouth daily.   .  clindamycin (CLEOCIN) 300 MG capsule Take 300 mg by mouth 3 (three) times daily. Reported on 11/12/2015   . insulin aspart (NOVOLOG) 100 UNIT/ML injection Inject into the skin 3 (three) times daily before meals. Reported on 11/12/2015 04/03/2014: 4 units plus sliding scale  . linagliptin (TRADJENTA) 5 MG TABS tablet Take 5 mg by mouth daily.   . quinapril (ACCUPRIL) 40 MG tablet Take 40 mg by mouth at bedtime. Reported on 11/12/2015 08/12/2014: Take in morning   No facility-administered encounter medications on file as of 04/14/2016.     Functional Status:   In your present state of health, do you have any difficulty performing the following activities: 01/21/2016 11/12/2015  Hearing? N N  Vision? N N  Difficulty concentrating or making decisions? N N  Walking or climbing stairs? N N  Dressing or bathing? N N  Doing errands, shopping? N N  Preparing Food and eating ? N N  Using the Toilet? N N  In the past six months, have you accidently leaked urine? N N  Do you have problems with loss of bowel control? N N  Managing your Medications? N N  Managing your Finances? N N  Housekeeping or managing your Housekeeping? N N  Some recent data might be hidden    Fall/Depression Screening:    PHQ 2/9 Scores 01/21/2016 11/12/2015 06/25/2015 12/09/2014 04/03/2014  PHQ - 2 Score 0 0 0 0 0    Assessment:   [employer] employee  and Link To Wellness member with Type II DM, HTN hyperlipidemia and obesity.  Plan:  [facility] CM Care Plan Problem One   Flowsheet Row Most Recent Value  Care Plan Problem One Link To Wellness member with Type II DM, HTN, hyperlipidemia and obesity meeting treatment targets for HTN and lipids as evidenced by normal lipid profile on 06/07/15 and BP readings consistently <130/<80, not meeting treatment Hgb A1C target as most recent one was 7.6% in August 2017   Role Documenting the Problem One Care Management [place] for Problem One Active  THN Long Term Goal (31-90 days)  Good control of chronic disease states as evidenced by meeting treatment targets for Type II DM, HTN and hyperlipidemia as evidenced by Hgb A1C <7.0%, BP readings consistently <130/<80 , normal lipid profile and evidence of weight loss or no weight gain at each assessment, Xitlalic will be an active participate in the [employer] in 2018  [place] Term Goal Start Date 04/14/16  Interventions for Problem One Long Term Goal Reviewed DM self management skills, encouraged Katori to continue her consistent exercise program, reviewed new blood pressure guidelines, discussed changes to Link To Wellness program for 2018, ensured Tamre is enrolled in the [facility], follow up for disease self management will occur via [place] in 2018      RNCM to fax today's office visit note to Dr. Criss Rosales.   Barrington Ellison RN,CCM,CDE [place] Management Coordinator Link To Wellness Office Phone [phone] Office Fax [phone]

## 2016-04-19 MED FILL — metFORMIN HCL 1000 MG TABS: 1000 | 90 days supply | Qty: 180 | Fill #4

## 2016-04-20 MED FILL — EZETIMIBE 10 MG TABLET: 10 | 90 days supply | Qty: 90 | Fill #0

## 2016-04-20 MED FILL — VALSARTAN 160 MG TABLET: 160 | 90 days supply | Qty: 90 | Fill #0

## 2016-05-04 MED FILL — INVOKANA 300 MG TABLET: 300 | 90 days supply | Qty: 90 | Fill #1

## 2016-05-04 MED FILL — HYDROCHLOROTHIAZIDE 25 MG T: 25 | 90 days supply | Qty: 90 | Fill #1 | Status: TO

## 2016-06-01 ENCOUNTER — Other Ambulatory Visit (HOSPITAL_COMMUNITY): Payer: Self-pay | Admitting: Family Medicine

## 2016-06-01 DIAGNOSIS — Z1231 Encounter for screening mammogram for malignant neoplasm of breast: Secondary | ICD-10-CM

## 2016-06-04 DIAGNOSIS — E782 Mixed hyperlipidemia: Secondary | ICD-10-CM | POA: Diagnosis not present

## 2016-06-04 DIAGNOSIS — E119 Type 2 diabetes mellitus without complications: Secondary | ICD-10-CM | POA: Diagnosis not present

## 2016-06-04 DIAGNOSIS — M130 Polyarthritis, unspecified: Secondary | ICD-10-CM | POA: Diagnosis not present

## 2016-06-04 MED FILL — MOMETASONE FUROATE 50 MCG S: 50 | 30 days supply | Qty: 17 | Fill #0

## 2016-06-08 DIAGNOSIS — L851 Acquired keratosis [keratoderma] palmaris et plantaris: Secondary | ICD-10-CM | POA: Diagnosis not present

## 2016-06-08 DIAGNOSIS — B351 Tinea unguium: Secondary | ICD-10-CM | POA: Diagnosis not present

## 2016-06-08 DIAGNOSIS — E1142 Type 2 diabetes mellitus with diabetic polyneuropathy: Secondary | ICD-10-CM | POA: Diagnosis not present

## 2016-06-30 ENCOUNTER — Ambulatory Visit (HOSPITAL_COMMUNITY)
Admission: RE | Admit: 2016-06-30 | Discharge: 2016-06-30 | Disposition: A | Discharge status: Home / Self Care | Payer: 59 | Source: Ambulatory Visit | Attending: Family Medicine | Admitting: Family Medicine

## 2016-06-30 ENCOUNTER — Ambulatory Visit (HOSPITAL_COMMUNITY): Payer: 59

## 2016-06-30 DIAGNOSIS — Z1231 Encounter for screening mammogram for malignant neoplasm of breast: Secondary | ICD-10-CM | POA: Diagnosis not present

## 2016-07-14 MED FILL — VALSARTAN 160 MG TABLET: 160 | 90 days supply | Qty: 90 | Fill #1

## 2016-07-14 MED FILL — MOMETASONE FUROATE 50 MCG S: 50 | 30 days supply | Qty: 17 | Fill #1

## 2016-07-14 MED FILL — NOVOFINE 30 NEEDLES: 30G X 8 MM | 90 days supply | Qty: 100 | Fill #1

## 2016-07-14 MED FILL — RALOXIFENE HCL 60 MG TABLET: 60 | 90 days supply | Qty: 90 | Fill #2

## 2016-07-14 MED FILL — EZETIMIBE 10 MG TABLET: 10 | 90 days supply | Qty: 90 | Fill #1

## 2016-07-15 MED FILL — metFORMIN HCL 1000 MG TABS: 1000 | 90 days supply | Qty: 180 | Fill #0

## 2016-07-15 MED FILL — TRADJENTA 5 MG TABLET: 5 | 90 days supply | Qty: 90 | Fill #0

## 2016-07-15 MED FILL — HYDROCHLOROTHIAZIDE 25 MG T: 25 | 90 days supply | Qty: 90 | Fill #0 | Status: TO

## 2016-07-15 MED FILL — INVOKANA 300 MG TABLET: 300 | 90 days supply | Qty: 90 | Fill #0

## 2016-08-17 DIAGNOSIS — B351 Tinea unguium: Secondary | ICD-10-CM | POA: Diagnosis not present

## 2016-08-17 DIAGNOSIS — L851 Acquired keratosis [keratoderma] palmaris et plantaris: Secondary | ICD-10-CM | POA: Diagnosis not present

## 2016-08-17 DIAGNOSIS — E1142 Type 2 diabetes mellitus with diabetic polyneuropathy: Secondary | ICD-10-CM | POA: Diagnosis not present

## 2016-10-05 MED FILL — ACCU-CHEK GUIDE TEST STRIP: 90 days supply | Qty: 200 | Fill #0

## 2016-10-18 MED FILL — TRADJENTA 5 MG TABLET: 5 | 90 days supply | Qty: 90 | Fill #1

## 2016-10-18 MED FILL — HYDROCHLOROTHIAZIDE 25 MG T: 25 | 90 days supply | Qty: 90 | Fill #1 | Status: TO

## 2016-10-18 MED FILL — VALSARTAN 160 MG TABLET: 160 | 90 days supply | Qty: 90 | Fill #0

## 2016-10-18 MED FILL — NOVOFINE 30 NEEDLES: 30G X 8 MM | 90 days supply | Qty: 100 | Fill #2

## 2016-10-18 MED FILL — EZETIMIBE 10 MG TAB: 10 | 90 days supply | Qty: 90 | Fill #0

## 2016-10-18 MED FILL — metFORMIN HCL 1000 MG TABS: 1000 | 90 days supply | Qty: 180 | Fill #1

## 2016-10-20 MED FILL — INVOKANA 300 MG TABLET: 300 | 90 days supply | Qty: 90 | Fill #1

## 2016-11-09 DIAGNOSIS — E1142 Type 2 diabetes mellitus with diabetic polyneuropathy: Secondary | ICD-10-CM | POA: Diagnosis not present

## 2016-11-09 DIAGNOSIS — L851 Acquired keratosis [keratoderma] palmaris et plantaris: Secondary | ICD-10-CM | POA: Diagnosis not present

## 2016-11-09 DIAGNOSIS — B351 Tinea unguium: Secondary | ICD-10-CM | POA: Diagnosis not present

## 2016-11-19 DIAGNOSIS — E113291 Type 2 diabetes mellitus with mild nonproliferative diabetic retinopathy without macular edema, right eye: Secondary | ICD-10-CM | POA: Diagnosis not present

## 2016-11-19 DIAGNOSIS — E119 Type 2 diabetes mellitus without complications: Secondary | ICD-10-CM | POA: Diagnosis not present

## 2016-11-19 DIAGNOSIS — H26492 Other secondary cataract, left eye: Secondary | ICD-10-CM | POA: Diagnosis not present

## 2016-11-29 MED FILL — ALENDRONATE NA 70 MG TAB: 70 | 84 days supply | Qty: 12 | Fill #0

## 2016-12-03 DIAGNOSIS — E782 Mixed hyperlipidemia: Secondary | ICD-10-CM | POA: Diagnosis not present

## 2016-12-03 DIAGNOSIS — E119 Type 2 diabetes mellitus without complications: Secondary | ICD-10-CM | POA: Diagnosis not present

## 2016-12-03 DIAGNOSIS — M130 Polyarthritis, unspecified: Secondary | ICD-10-CM | POA: Diagnosis not present

## 2016-12-03 MED FILL — TELMISARTAN 40 MG TABLET: 40 | 30 days supply | Qty: 30 | Fill #0

## 2016-12-28 MED FILL — TELMISARTAN 40 MG TABLET: 40 | 30 days supply | Qty: 30 | Fill #1

## 2016-12-29 MED FILL — LANTUS SOLOSTAR 100 UNITS/M: 100 | 90 days supply | Qty: 33 | Fill #0

## 2016-12-31 DIAGNOSIS — E119 Type 2 diabetes mellitus without complications: Secondary | ICD-10-CM | POA: Diagnosis not present

## 2016-12-31 DIAGNOSIS — G520 Disorders of olfactory nerve: Secondary | ICD-10-CM | POA: Diagnosis not present

## 2016-12-31 DIAGNOSIS — E782 Mixed hyperlipidemia: Secondary | ICD-10-CM | POA: Diagnosis not present

## 2016-12-31 DIAGNOSIS — M130 Polyarthritis, unspecified: Secondary | ICD-10-CM | POA: Diagnosis not present

## 2016-12-31 DIAGNOSIS — I10 Essential (primary) hypertension: Secondary | ICD-10-CM | POA: Diagnosis not present

## 2017-01-18 DIAGNOSIS — B351 Tinea unguium: Secondary | ICD-10-CM | POA: Diagnosis not present

## 2017-01-18 DIAGNOSIS — L851 Acquired keratosis [keratoderma] palmaris et plantaris: Secondary | ICD-10-CM | POA: Diagnosis not present

## 2017-01-18 DIAGNOSIS — E1142 Type 2 diabetes mellitus with diabetic polyneuropathy: Secondary | ICD-10-CM | POA: Diagnosis not present

## 2017-01-20 MED FILL — NOVOFINE 30 NEEDLES: 30G X 8 MM | 90 days supply | Qty: 100 | Fill #3

## 2017-01-20 MED FILL — HYDROCHLOROTHIAZIDE 25 MG T: 25 | 90 days supply | Qty: 90 | Fill #0 | Status: TO

## 2017-01-20 MED FILL — EZETIMIBE 10 MG TAB: 10 | 90 days supply | Qty: 90 | Fill #1

## 2017-01-20 MED FILL — TRADJENTA 5 MG TABLET: 5 | 90 days supply | Qty: 90 | Fill #0

## 2017-01-20 MED FILL — metFORMIN HCL 1000 MG TABS: 1000 | 90 days supply | Qty: 180 | Fill #2

## 2017-01-20 MED FILL — INVOKANA 300 MG TABLET: 300 | 90 days supply | Qty: 90 | Fill #0

## 2017-01-31 MED FILL — MOMETASONE FUROATE 50 MCG S: 50 | 30 days supply | Qty: 17 | Fill #2

## 2017-02-01 MED FILL — TELMISARTAN 40 MG TABLET: 40 | 30 days supply | Qty: 30 | Fill #0

## 2017-02-28 MED FILL — TELMISARTAN 40 MG TABLET: 40 | 30 days supply | Qty: 30 | Fill #1

## 2017-03-01 ENCOUNTER — Telehealth: Payer: Self-pay

## 2017-03-01 NOTE — Telephone Encounter (Signed)
Patient called again to see if DS had tried calling because she's been in and out of the office (She works at [employer]).

## 2017-03-01 NOTE — Telephone Encounter (Signed)
Pt said she was due for her colonoscopy. No GI problems, no blood thinners, no hx of heart attacks in the past 12 months. Please call her at [phone]

## 2017-03-03 ENCOUNTER — Telehealth: Payer: Self-pay

## 2017-03-08 NOTE — Telephone Encounter (Signed)
Gastroenterology Pre-Procedure Review  Request Date: 03/03/2017 Requesting Physician:   PATIENT REVIEW QUESTIONS: The patient responded to the following health history questions as indicated:    1. Diabetes Melitis: YES 2. Joint replacements in the past 12 months: no 3. Major health problems in the past 3 months: no 4. Has an artificial valve or MVP: no 5. Has a defibrillator: no 6. Has been advised in past to take antibiotics in advance of a procedure like teeth cleaning: no 7. Family history of colon cancer: no  8. Alcohol Use: no 9. History of sleep apnea: no  10. History of coronary artery or other vascular stents placed within the last 12 months: no 11. History of any prior anesthesia complications: no    MEDICATIONS & ALLERGIES:    Patient reports the following regarding taking any blood thinners:   Plavix? no Aspirin? no Coumadin? no Brilinta? no Xarelto? no Eliquis? no Pradaxa? no Savaysa? no Effient? no  Patient confirms/reports the following medications:  Current Outpatient Medications  Medication Sig Dispense Refill  . Canagliflozin (INVOKANA) 300 MG TABS Take 300 mg by mouth daily before breakfast.     . ezetimibe (ZETIA) 10 MG tablet Take 10 mg by mouth daily.    . hydrochlorothiazide (HYDRODIURIL) 25 MG tablet Take 25 mg by mouth daily.    . insulin glargine (LANTUS) 100 UNIT/ML injection Inject 36 Units into the skin at bedtime.     Marland Kitchen linagliptin (TRADJENTA) 5 MG TABS tablet Take 5 mg by mouth every evening.     . metFORMIN (GLUCOPHAGE) 1000 MG tablet Take 2,000 mg by mouth daily.    . mometasone (NASONEX) 50 MCG/ACT nasal spray Place 2 sprays into the nose daily.    . valsartan (DIOVAN) 40 MG tablet Take 40 mg by mouth daily.     No current facility-administered medications for this visit.     Patient confirms/reports the following allergies:  Allergies  Allergen Reactions  . Augmentin [Amoxicillin-Pot Clavulanate] Shortness Of Breath  . Fish Allergy      No orders of the defined types were placed in this encounter.   AUTHORIZATION INFORMATION Primary Insurance:   ID #:   Group #:  Pre-Cert / Auth required:  Pre-Cert / Auth #:   Secondary Insurance:   ID #: Group #:  Pre-Cert / Auth required:  Pre-Cert / Auth #:   SCHEDULE INFORMATION: Procedure has been scheduled as follows:  Date:  04/11/2017                Time: 9:30 Am  Location: [facility] Short Stay  This Gastroenterology Pre-Precedure Review Form is being routed to the following provider(s): Barney Drain, MD

## 2017-03-10 NOTE — Telephone Encounter (Signed)
Day before procedure: invokana 1/2 tab dialy. Lantus 18 units at bedtime, trandjenta 1/2 tab, metformin 1000mg  dialy.   AM of procdure: no invokana, lantus, tradjenta, metformin.

## 2017-03-11 MED ORDER — PEG 3350-KCL-NA BICARB-NACL 420 G PO SOLR: 4000.0000 mL | ORAL | 0 refills | Status: DC

## 2017-03-11 MED FILL — PEG-3350 SOLUTION: 420 | 1 days supply | Qty: 4000 | Fill #0

## 2017-03-11 NOTE — Telephone Encounter (Signed)
Rx sent to the pharmacy and instructions mailed to pt.  

## 2017-03-29 DIAGNOSIS — E1142 Type 2 diabetes mellitus with diabetic polyneuropathy: Secondary | ICD-10-CM | POA: Diagnosis not present

## 2017-03-29 DIAGNOSIS — L851 Acquired keratosis [keratoderma] palmaris et plantaris: Secondary | ICD-10-CM | POA: Diagnosis not present

## 2017-03-29 DIAGNOSIS — B351 Tinea unguium: Secondary | ICD-10-CM | POA: Diagnosis not present

## 2017-04-04 MED FILL — ACCU-CHEK GUIDE TEST STRIP: 90 days supply | Qty: 200 | Fill #1

## 2017-04-04 MED FILL — LANTUS SOLOSTAR 100 UNITS/M: 100 | 90 days supply | Qty: 33 | Fill #1

## 2017-04-04 MED FILL — TELMISARTAN 40 MG TABLET: 40 | 30 days supply | Qty: 30 | Fill #0

## 2017-04-06 ENCOUNTER — Other Ambulatory Visit: Payer: Self-pay

## 2017-04-06 ENCOUNTER — Other Ambulatory Visit: Payer: Self-pay | Admitting: *Deleted

## 2017-04-06 ENCOUNTER — Telehealth: Payer: Self-pay

## 2017-04-06 DIAGNOSIS — Z1211 Encounter for screening for malignant neoplasm of colon: Secondary | ICD-10-CM

## 2017-04-06 NOTE — Telephone Encounter (Signed)
I called [facility] and spoke to [place] who said PA is not required for the colonoscopy. Ref # [id number].

## 2017-04-06 NOTE — Patient Outreach (Signed)
Shelly transitioned from the [employer] To Wellness program to the [employer] on 04/28/16 for Type II diabetes self-management assistance so will close case to the diabetes Link To Wellness program due to delegation of disease management services to [employer] from [employer] for [employer] members in 2019. Barrington Ellison RN,CCM,CDE [place] Management Coordinator Link To Wellness and [employer] [phone] Office Fax [phone]

## 2017-04-08 ENCOUNTER — Telehealth: Payer: Self-pay

## 2017-04-08 NOTE — Telephone Encounter (Signed)
Pt called to cancel colonoscopy on Monday, 04/11/2017 due to inclement weather. She will call back early next week when she gets her calendar to reschedule for Jan. I have called and [facility] for [facility] in endo.

## 2017-04-11 ENCOUNTER — Encounter (HOSPITAL_COMMUNITY): Admission: RE | Payer: Self-pay | Source: Ambulatory Visit

## 2017-04-11 ENCOUNTER — Ambulatory Visit (HOSPITAL_COMMUNITY): Admission: RE | Admit: 2017-04-11 | Payer: 59 | Source: Ambulatory Visit | Admitting: Gastroenterology

## 2017-04-11 SURGERY — COLONOSCOPY
Anesthesia: Moderate Sedation

## 2017-04-19 MED FILL — EZETIMIBE 10 MG TABS: 10 | 90 days supply | Qty: 90 | Fill #0

## 2017-04-19 MED FILL — INVOKANA 300 MG TABLET: 300 | 90 days supply | Qty: 90 | Fill #1

## 2017-04-19 MED FILL — metFORMIN HCL 1000 MG TABS: 1000 | 90 days supply | Qty: 180 | Fill #3

## 2017-04-19 MED FILL — HYDROCHLOROTHIAZIDE 25 MG T: 25 | 90 days supply | Qty: 90 | Fill #0 | Status: TO

## 2017-04-19 MED FILL — TRADJENTA 5 MG TABLET: 5 | 90 days supply | Qty: 90 | Fill #1

## 2017-04-20 MED FILL — NOVOFINE 30 NEEDLES: 30G X 8 MM | 90 days supply | Qty: 100 | Fill #0

## 2017-05-04 MED FILL — TELMISARTAN 40 MG TABLET: 40 | 30 days supply | Qty: 30 | Fill #1

## 2017-06-02 MED FILL — TELMISARTAN 40 MG TABLET: 40 | 30 days supply | Qty: 30 | Fill #0

## 2017-06-07 DIAGNOSIS — L851 Acquired keratosis [keratoderma] palmaris et plantaris: Secondary | ICD-10-CM | POA: Diagnosis not present

## 2017-06-07 DIAGNOSIS — B351 Tinea unguium: Secondary | ICD-10-CM | POA: Diagnosis not present

## 2017-06-07 DIAGNOSIS — E1142 Type 2 diabetes mellitus with diabetic polyneuropathy: Secondary | ICD-10-CM | POA: Diagnosis not present

## 2017-07-01 MED FILL — LANTUS SOLOSTAR 100 UNITS/M: 100 | 63 days supply | Qty: 24 | Fill #2

## 2017-07-01 MED FILL — TELMISARTAN 40 MG TABLET: 40 | 30 days supply | Qty: 30 | Fill #1

## 2017-07-25 ENCOUNTER — Other Ambulatory Visit (HOSPITAL_COMMUNITY): Payer: Self-pay | Admitting: Family Medicine

## 2017-07-25 DIAGNOSIS — Z1231 Encounter for screening mammogram for malignant neoplasm of breast: Secondary | ICD-10-CM

## 2017-07-25 MED FILL — EZETIMIBE 10 MG TABS: 10 | 90 days supply | Qty: 90 | Fill #1

## 2017-07-25 MED FILL — UNIFINE PENTIPS 8MM 31G: 31G X 8 MM | 90 days supply | Qty: 100 | Fill #0

## 2017-07-26 MED FILL — INVOKANA 300 MG TABLET: 300 | 90 days supply | Qty: 90 | Fill #0

## 2017-07-26 MED FILL — metFORMIN HCL 1000 MG TABS: 1000 | 90 days supply | Qty: 180 | Fill #0

## 2017-07-26 MED FILL — HYDROCHLOROTHIAZIDE 25 MG T: 25 | 90 days supply | Qty: 90 | Fill #0 | Status: TO

## 2017-07-27 ENCOUNTER — Ambulatory Visit (HOSPITAL_COMMUNITY)
Admission: RE | Admit: 2017-07-27 | Discharge: 2017-07-27 | Disposition: A | Discharge status: Home / Self Care | Payer: 59 | Source: Ambulatory Visit | Attending: Family Medicine | Admitting: Family Medicine

## 2017-07-27 DIAGNOSIS — Z1231 Encounter for screening mammogram for malignant neoplasm of breast: Secondary | ICD-10-CM | POA: Diagnosis not present

## 2017-08-12 DIAGNOSIS — E782 Mixed hyperlipidemia: Secondary | ICD-10-CM | POA: Diagnosis not present

## 2017-08-12 DIAGNOSIS — I10 Essential (primary) hypertension: Secondary | ICD-10-CM | POA: Diagnosis not present

## 2017-08-12 DIAGNOSIS — M130 Polyarthritis, unspecified: Secondary | ICD-10-CM | POA: Diagnosis not present

## 2017-08-12 DIAGNOSIS — E6609 Other obesity due to excess calories: Secondary | ICD-10-CM | POA: Diagnosis not present

## 2017-08-12 DIAGNOSIS — E119 Type 2 diabetes mellitus without complications: Secondary | ICD-10-CM | POA: Diagnosis not present

## 2017-08-12 MED FILL — TRADJENTA 5 MG TABLET: 5 | 90 days supply | Qty: 90 | Fill #0

## 2017-08-12 MED FILL — TELMISARTAN 40 MG TABLET: 40 | 90 days supply | Qty: 90 | Fill #0

## 2017-08-18 DIAGNOSIS — Z0000 Encounter for general adult medical examination without abnormal findings: Secondary | ICD-10-CM | POA: Diagnosis not present

## 2017-08-30 DIAGNOSIS — B351 Tinea unguium: Secondary | ICD-10-CM | POA: Diagnosis not present

## 2017-08-30 DIAGNOSIS — L851 Acquired keratosis [keratoderma] palmaris et plantaris: Secondary | ICD-10-CM | POA: Diagnosis not present

## 2017-08-30 DIAGNOSIS — E1142 Type 2 diabetes mellitus with diabetic polyneuropathy: Secondary | ICD-10-CM | POA: Diagnosis not present

## 2017-08-31 MED FILL — LANTUS SOLOSTAR 100 UNITS/M: 100 | 40 days supply | Qty: 15 | Fill #0

## 2017-09-29 DIAGNOSIS — I10 Essential (primary) hypertension: Secondary | ICD-10-CM | POA: Diagnosis not present

## 2017-09-29 DIAGNOSIS — E118 Type 2 diabetes mellitus with unspecified complications: Secondary | ICD-10-CM | POA: Diagnosis not present

## 2017-10-14 MED FILL — LANTUS SOLOSTAR 100 UNITS/M: 100 | 40 days supply | Qty: 15 | Fill #1

## 2017-10-18 MED FILL — metFORMIN HCL 1000 MG TABS: 1000 | 90 days supply | Qty: 180 | Fill #1

## 2017-10-20 MED FILL — HYDROCHLOROTHIAZIDE 25 MG T: 25 | 90 days supply | Qty: 90 | Fill #0 | Status: TO

## 2017-10-20 MED FILL — EZETIMIBE 10 MG TABLET: 10 | 90 days supply | Qty: 90 | Fill #0

## 2017-11-01 ENCOUNTER — Other Ambulatory Visit (HOSPITAL_COMMUNITY): Payer: Self-pay | Admitting: Family Medicine

## 2017-11-01 DIAGNOSIS — E2839 Other primary ovarian failure: Secondary | ICD-10-CM

## 2017-11-01 MED FILL — TELMISARTAN 40 MG TABLET: 40 | 90 days supply | Qty: 90 | Fill #0

## 2017-11-16 ENCOUNTER — Other Ambulatory Visit (HOSPITAL_COMMUNITY): Payer: 59

## 2017-11-16 ENCOUNTER — Ambulatory Visit (HOSPITAL_COMMUNITY)
Admission: RE | Admit: 2017-11-16 | Discharge: 2017-11-16 | Disposition: A | Discharge status: Home / Self Care | Payer: 59 | Source: Ambulatory Visit | Attending: Family Medicine | Admitting: Family Medicine

## 2017-11-16 DIAGNOSIS — E2839 Other primary ovarian failure: Secondary | ICD-10-CM | POA: Insufficient documentation

## 2017-11-16 DIAGNOSIS — Z780 Asymptomatic menopausal state: Secondary | ICD-10-CM | POA: Diagnosis not present

## 2017-11-16 DIAGNOSIS — Z13820 Encounter for screening for osteoporosis: Secondary | ICD-10-CM | POA: Diagnosis not present

## 2017-11-22 DIAGNOSIS — E1142 Type 2 diabetes mellitus with diabetic polyneuropathy: Secondary | ICD-10-CM | POA: Diagnosis not present

## 2017-11-22 DIAGNOSIS — B351 Tinea unguium: Secondary | ICD-10-CM | POA: Diagnosis not present

## 2017-11-22 DIAGNOSIS — L851 Acquired keratosis [keratoderma] palmaris et plantaris: Secondary | ICD-10-CM | POA: Diagnosis not present

## 2017-11-22 MED FILL — TRULICITY 1.5 MG/0.5 ML PEN: 1.5 | 28 days supply | Qty: 2 | Fill #0

## 2017-11-24 MED FILL — UNIFINE PENTIPS 8MM 31G: 31G X 8 MM | 90 days supply | Qty: 100 | Fill #1

## 2017-11-24 MED FILL — LANTUS SOLOSTAR 100 UNITS/M: 100 | 40 days supply | Qty: 15 | Fill #0

## 2017-12-06 MED FILL — SHINGRIX 50 MCG SUS: 50 | 1 days supply | Qty: 1 | Fill #0

## 2017-12-15 DIAGNOSIS — E119 Type 2 diabetes mellitus without complications: Secondary | ICD-10-CM | POA: Diagnosis not present

## 2017-12-15 DIAGNOSIS — I10 Essential (primary) hypertension: Secondary | ICD-10-CM | POA: Diagnosis not present

## 2017-12-15 DIAGNOSIS — E1100 Type 2 diabetes mellitus with hyperosmolarity without nonketotic hyperglycemic-hyperosmolar coma (NKHHC): Secondary | ICD-10-CM | POA: Diagnosis not present

## 2017-12-19 MED FILL — TRULICITY 1.5 MG/0.5 ML PEN: 1.5 | 28 days supply | Qty: 2 | Fill #1

## 2018-01-10 MED FILL — LANTUS SOLOSTAR 100 UNITS/M: 100 | 40 days supply | Qty: 15 | Fill #1

## 2018-01-10 MED FILL — HYDROCHLOROTHIAZIDE 25 MG T: 25 | 90 days supply | Qty: 90 | Fill #1 | Status: TO

## 2018-01-10 MED FILL — EZETIMIBE 10 MG TABLET: 10 | 90 days supply | Qty: 90 | Fill #1

## 2018-01-10 MED FILL — TRULICITY 1.5 MG/0.5 ML PEN: 1.5 | 28 days supply | Qty: 2 | Fill #2

## 2018-01-10 MED FILL — metFORMIN HCL 1000 MG TABS: 1000 | 90 days supply | Qty: 180 | Fill #2

## 2018-01-11 DIAGNOSIS — E1100 Type 2 diabetes mellitus with hyperosmolarity without nonketotic hyperglycemic-hyperosmolar coma (NKHHC): Secondary | ICD-10-CM | POA: Diagnosis not present

## 2018-01-11 DIAGNOSIS — I10 Essential (primary) hypertension: Secondary | ICD-10-CM | POA: Diagnosis not present

## 2018-01-16 ENCOUNTER — Ambulatory Visit (HOSPITAL_COMMUNITY)
Admission: EM | Admit: 2018-01-16 | Discharge: 2018-01-16 | Disposition: A | Discharge status: Home / Self Care | Payer: 59 | Attending: Family Medicine | Admitting: Family Medicine

## 2018-01-16 ENCOUNTER — Encounter (HOSPITAL_COMMUNITY): Payer: Self-pay

## 2018-01-16 DIAGNOSIS — E11628 Type 2 diabetes mellitus with other skin complications: Secondary | ICD-10-CM

## 2018-01-16 DIAGNOSIS — R2241 Localized swelling, mass and lump, right lower limb: Secondary | ICD-10-CM

## 2018-01-16 DIAGNOSIS — W57XXXA Bitten or stung by nonvenomous insect and other nonvenomous arthropods, initial encounter: Secondary | ICD-10-CM | POA: Diagnosis not present

## 2018-01-16 DIAGNOSIS — S90561A Insect bite (nonvenomous), right ankle, initial encounter: Secondary | ICD-10-CM | POA: Diagnosis not present

## 2018-01-16 DIAGNOSIS — M25471 Effusion, right ankle: Secondary | ICD-10-CM

## 2018-01-16 MED ORDER — NAPROXEN 250 MG PO TABS: 250.0000 mg | ORAL_TABLET | Freq: Two times a day (BID) | ORAL | 0 refills | Status: AC

## 2018-01-16 MED ORDER — SULFAMETHOXAZOLE-TRIMETHOPRIM 800-160 MG PO TABS: 1.0000 | ORAL_TABLET | Freq: Two times a day (BID) | ORAL | 0 refills | Status: AC

## 2018-01-16 MED FILL — NAPROXEN 250 MG TABLET: 250 | 10 days supply | Qty: 20 | Fill #0

## 2018-01-16 MED FILL — SULFAMETHOXAZOLE-TMP DS TAB: 800-160 | 7 days supply | Qty: 14 | Fill #0

## 2018-01-16 NOTE — ED Triage Notes (Signed)
Pt presents with right swollen ankle and pain from a bite from a horse fly last week.

## 2018-01-16 NOTE — Discharge Instructions (Signed)
Infection vs allergic reaction to bug bite considered here today.  We will treat with antiinflammatories as well as ACE wrap to help with swelling.  Complete course of antibiotics.  I am concerned about possible abscess to the right side of your foot which may need to be drained to allow for healing.  Please return in 2-4 days for recheck to ensure healing and no further treatment necessary.

## 2018-01-16 NOTE — ED Provider Notes (Signed)
[facility]    CSN: [id number] Arrival date & time: 01/16/18  1446     History   Chief Complaint Chief Complaint  Patient presents with  . Ankle Pain    Right    HPI Amanda Estes is a 72 y.o. female.   Amanda Estes presents with complaints of swelling and mild pain to right ankle. States started after she was bit by a horse fly at a friends house a week ago. She states she had a redness and rash at her achilles which improved with soaks and topical cream. The swelling persisted however and some mild tenderness, primarily to lateral ankle. Noted a "blister" present which is mildly tender. Swelling improves over night and returns during the day. No fevers. Has taken tylenol and ibuprofen which help some. No numbness or tingling. Denies any previous similar. No drainage. Hx of dm, hyperlipidemia, htn. No left foot swelling. No injury to the ankle.    ROS per HPI.      Past Medical History:  Diagnosis Date  . Diabetes mellitus   . Hyperlipidemia   . Obesity     Patient Active Problem List   Diagnosis Date Noted  . Diabetes ([place]) 03/05/2013    Past Surgical History:  Procedure Laterality Date  . CATARACT EXTRACTION Right   . CATARACT EXTRACTION W/PHACO Left 04/04/2014   Procedure: CATARACT EXTRACTION PHACO AND INTRAOCULAR LENS PLACEMENT LEFT EYE;  Surgeon: Tonny Branch, MD;  Location: [facility];  Service: Ophthalmology;  Laterality: Left;  CDE: 13.36  . MYOMECTOMY  2005    OB History   None      Home Medications    Prior to Admission medications   Medication Sig Start Date End Date Taking? Authorizing Provider  dorzolamide (TRUSOPT) 2 % ophthalmic solution 1 drop 3 (three) times daily.   Yes [provider]  telmisartan (MICARDIS) 40 MG tablet Take 40 mg by mouth daily.   Yes [provider]  Canagliflozin (INVOKANA) 300 MG TABS Take 300 mg by mouth daily before breakfast.     [provider]  ezetimibe (ZETIA) 10 MG tablet  Take 10 mg by mouth daily.    [provider]  hydrochlorothiazide (HYDRODIURIL) 25 MG tablet Take 25 mg by mouth daily.    [provider]  insulin glargine (LANTUS) 100 UNIT/ML injection Inject 36 Units into the skin at bedtime.     [provider]  linagliptin (TRADJENTA) 5 MG TABS tablet Take 5 mg by mouth every evening.     [provider]  metFORMIN (GLUCOPHAGE) 1000 MG tablet Take 1,000 mg by mouth 2 (two) times daily with a meal.     [provider]  mometasone (NASONEX) 50 MCG/ACT nasal spray Place 2 sprays into the nose daily as needed.     [provider]  naproxen (NAPROSYN) 250 MG tablet Take 1 tablet (250 mg total) by mouth 2 (two) times daily with a meal. 01/16/18   Burky, Lanelle Bal B, NP  sulfamethoxazole-trimethoprim (BACTRIM DS) 800-160 MG tablet Take 1 tablet by mouth 2 (two) times daily for 7 days. 01/16/18 01/23/18  Zigmund Gottron, NP  valsartan (DIOVAN) 40 MG tablet Take 40 mg by mouth daily.    [provider]    Family History Family History  Problem Relation Age of Onset  . Cancer Other   . Obesity Other     Social History Social History   Tobacco Use  . Smoking status: Never Smoker  .  Smokeless tobacco: Never Used  Substance Use Topics  . Alcohol use: No  . Drug use: No     Allergies   Augmentin [amoxicillin-pot clavulanate] and Fish allergy   Review of Systems Review of Systems   Physical Exam Triage Vital Signs ED Triage Vitals  Enc Vitals Group     BP 01/16/18 1515 138/80     Pulse Rate 01/16/18 1515 70     Resp 01/16/18 1515 20     Temp 01/16/18 1515 97.7 F (36.5 C)     Temp Source 01/16/18 1515 Oral     SpO2 01/16/18 1515 99 %     Weight --      Height --      Head Circumference --      Peak Flow --      Pain Score 01/16/18 1516 8     Pain Loc --      Pain Edu? --      Excl. in [place]? --    No data found.  Updated Vital Signs BP 138/80 (BP Location: Right Arm)   Pulse  70   Temp 97.7 F (36.5 C) (Oral)   Resp 20   SpO2 99%   Visual Acuity Right Eye Distance:   Left Eye Distance:   Bilateral Distance:    Right Eye Near:   Left Eye Near:    Bilateral Near:     Physical Exam  Constitutional: She is oriented to person, place, and time. She appears well-developed and well-nourished. No distress.  Cardiovascular: Normal rate, regular rhythm and normal heart sounds.  Pulmonary/Chest: Effort normal and breath sounds normal.  Musculoskeletal:       Right ankle: She exhibits swelling. She exhibits normal range of motion, no ecchymosis, no deformity, no laceration and normal pulse.       Feet:  Right ankle with generalized swelling; no warmth and minimal redness; approximately 1 cm area of fluctuance to right lateral foot, just inferior to lateral malleolus; mild tenderness to this and to surrounding tissue; no redness or warm; no active drainage; strong pedal pulse; cap refill < 2 seconds ; full ankle ROM   Neurological: She is alert and oriented to person, place, and time.  Skin: Skin is warm and dry.         UC Treatments / Results  Labs (all labs ordered are listed, but only abnormal results are displayed) Labs Reviewed - No data to display  EKG None  Radiology No results found.  Procedures Procedures (including critical care time)  Medications Ordered in UC Medications - No data to display  Initial Impression / Assessment and Plan / UC Course  I have reviewed the triage vital signs and the nursing notes.  Pertinent labs & imaging results that were available during my care of the patient were reviewed by me and considered in my medical decision making (see chart for details).     Patient uncertain if was bit to area of fluctuance. Blister vs cyst vs abscess considered. Swelling present in this diabetic patient. Discussed opening to drain this as concern for abscess, patient does not wish to open at this time due to potential risks  with diabetes. Will start course of bactrim, warm compresses, ace wrap to help with swelling. Encouraged follow up in the next few days so we can recheck and determine if drainage is appropriate or if improvement with medication regimen. Return precautions provided. Patient verbalized understanding and agreeable to plan.  Ambulatory out of clinic  without difficulty.    Final Clinical Impressions(s) / UC Diagnoses   Final diagnoses:  Bug bite, initial encounter  Right ankle swelling     Discharge Instructions     Infection vs allergic reaction to bug bite considered here today.  We will treat with antiinflammatories as well as ACE wrap to help with swelling.  Complete course of antibiotics.  I am concerned about possible abscess to the right side of your foot which may need to be drained to allow for healing.  Please return in 2-4 days for recheck to ensure healing and no further treatment necessary.    ED Prescriptions    Medication Sig Dispense Auth. Provider   sulfamethoxazole-trimethoprim (BACTRIM DS) 800-160 MG tablet Take 1 tablet by mouth 2 (two) times daily for 7 days. 14 tablet Augusto Gamble B, NP   naproxen (NAPROSYN) 250 MG tablet Take 1 tablet (250 mg total) by mouth 2 (two) times daily with a meal. 20 tablet Augusto Gamble B, NP     Controlled Substance Prescriptions [place] Controlled Substance Registry consulted? Not Applicable   Zigmund Gottron, NP 01/16/18 1553

## 2018-01-23 DIAGNOSIS — S90521S Blister (nonthermal), right ankle, sequela: Secondary | ICD-10-CM | POA: Diagnosis not present

## 2018-01-23 DIAGNOSIS — S00269A Insect bite (nonvenomous) of unspecified eyelid and periocular area, initial encounter: Secondary | ICD-10-CM | POA: Diagnosis not present

## 2018-01-23 DIAGNOSIS — S00269D Insect bite (nonvenomous) of unspecified eyelid and periocular area, subsequent encounter: Secondary | ICD-10-CM | POA: Diagnosis not present

## 2018-01-23 DIAGNOSIS — S90821A Blister (nonthermal), right foot, initial encounter: Secondary | ICD-10-CM | POA: Diagnosis not present

## 2018-01-30 DIAGNOSIS — E119 Type 2 diabetes mellitus without complications: Secondary | ICD-10-CM | POA: Diagnosis not present

## 2018-01-30 DIAGNOSIS — Z961 Presence of intraocular lens: Secondary | ICD-10-CM | POA: Diagnosis not present

## 2018-01-31 DIAGNOSIS — E1142 Type 2 diabetes mellitus with diabetic polyneuropathy: Secondary | ICD-10-CM | POA: Diagnosis not present

## 2018-01-31 DIAGNOSIS — L851 Acquired keratosis [keratoderma] palmaris et plantaris: Secondary | ICD-10-CM | POA: Diagnosis not present

## 2018-01-31 DIAGNOSIS — B351 Tinea unguium: Secondary | ICD-10-CM | POA: Diagnosis not present

## 2018-02-02 DIAGNOSIS — S00269D Insect bite (nonvenomous) of unspecified eyelid and periocular area, subsequent encounter: Secondary | ICD-10-CM | POA: Diagnosis not present

## 2018-02-02 DIAGNOSIS — I10 Essential (primary) hypertension: Secondary | ICD-10-CM | POA: Diagnosis not present

## 2018-02-02 DIAGNOSIS — S90521S Blister (nonthermal), right ankle, sequela: Secondary | ICD-10-CM | POA: Diagnosis not present

## 2018-02-07 MED FILL — TRULICITY 1.5 MG/0.5 ML PEN: 1.5 | 28 days supply | Qty: 2 | Fill #3

## 2018-02-07 MED FILL — TELMISARTAN 40 MG TABLET: 40 | 90 days supply | Qty: 90 | Fill #1

## 2018-02-07 MED FILL — ACCU-CHEK GUIDE TEST STRIP: 90 days supply | Qty: 200 | Fill #0

## 2018-02-20 MED FILL — LANTUS SOLOSTAR 100 UNITS/M: 100 | 40 days supply | Qty: 15 | Fill #0

## 2018-03-06 MED FILL — SHINGRIX 50 MCG SUS: 50 | 1 days supply | Qty: 1 | Fill #1

## 2018-03-08 MED FILL — UNIFINE PENTIPS 8MM 31G: 31G X 8 MM | 90 days supply | Qty: 100 | Fill #2

## 2018-03-09 DIAGNOSIS — E119 Type 2 diabetes mellitus without complications: Secondary | ICD-10-CM | POA: Diagnosis not present

## 2018-03-09 MED FILL — TRULICITY 1.5 MG/0.5 ML PEN: 1.5 | 28 days supply | Qty: 2 | Fill #4

## 2018-03-10 MED FILL — JARDIANCE 25 MG TABLET: 25 | 90 days supply | Qty: 90 | Fill #0

## 2018-03-16 ENCOUNTER — Other Ambulatory Visit (HOSPITAL_COMMUNITY)
Admission: RE | Admit: 2018-03-16 | Discharge: 2018-03-16 | Disposition: A | Discharge status: Home / Self Care | Payer: 59 | Source: Ambulatory Visit | Attending: Family Medicine | Admitting: Family Medicine

## 2018-03-16 DIAGNOSIS — E1100 Type 2 diabetes mellitus with hyperosmolarity without nonketotic hyperglycemic-hyperosmolar coma (NKHHC): Secondary | ICD-10-CM | POA: Diagnosis not present

## 2018-03-16 LAB — HEMOGLOBIN A1C
Hgb A1c MFr Bld: 6.7 % — ABNORMAL HIGH (ref 4.8–5.6)
Mean Plasma Glucose: 145.59 mg/dL

## 2018-04-04 MED FILL — TRULICITY 1.5 MG/0.5 ML PEN: 1.5 | 28 days supply | Qty: 2 | Fill #5

## 2018-04-11 DIAGNOSIS — E1142 Type 2 diabetes mellitus with diabetic polyneuropathy: Secondary | ICD-10-CM | POA: Diagnosis not present

## 2018-04-11 DIAGNOSIS — L851 Acquired keratosis [keratoderma] palmaris et plantaris: Secondary | ICD-10-CM | POA: Diagnosis not present

## 2018-04-11 DIAGNOSIS — B351 Tinea unguium: Secondary | ICD-10-CM | POA: Diagnosis not present

## 2018-04-11 MED FILL — LANTUS SOLOSTAR 100 UNITS/M: 100 | 40 days supply | Qty: 15 | Fill #1

## 2018-04-11 MED FILL — EZETIMIBE 10 MG TABS: 10 | 90 days supply | Qty: 90 | Fill #0

## 2018-04-11 MED FILL — metFORMIN HCL 1000 MG TABS: 1000 | 90 days supply | Qty: 180 | Fill #3

## 2018-04-11 MED FILL — HYDROCHLOROTHIAZIDE 25 MG T: 25 | 90 days supply | Qty: 90 | Fill #0 | Status: TO

## 2018-05-01 MED FILL — TRULICITY 1.5 MG/0.5 ML PEN: 1.5 | 28 days supply | Qty: 2 | Fill #6

## 2018-05-01 MED FILL — TELMISARTAN 40 MG TABLET: 40 | 90 days supply | Qty: 90 | Fill #1

## 2018-05-29 MED FILL — TRULICITY 1.5 MG/0.5 ML PEN: 1.5 | 28 days supply | Qty: 2 | Fill #7

## 2018-05-29 MED FILL — JARDIANCE 25 MG TABLET: 25 | 90 days supply | Qty: 90 | Fill #1

## 2018-05-30 MED FILL — LANTUS SOLOSTAR 100 UNITS/M: 100 | 40 days supply | Qty: 15 | Fill #0

## 2018-06-20 DIAGNOSIS — L851 Acquired keratosis [keratoderma] palmaris et plantaris: Secondary | ICD-10-CM | POA: Diagnosis not present

## 2018-06-20 DIAGNOSIS — B351 Tinea unguium: Secondary | ICD-10-CM | POA: Diagnosis not present

## 2018-06-20 DIAGNOSIS — E1142 Type 2 diabetes mellitus with diabetic polyneuropathy: Secondary | ICD-10-CM | POA: Diagnosis not present

## 2018-06-29 DIAGNOSIS — E1100 Type 2 diabetes mellitus with hyperosmolarity without nonketotic hyperglycemic-hyperosmolar coma (NKHHC): Secondary | ICD-10-CM | POA: Diagnosis not present

## 2018-06-29 DIAGNOSIS — I10 Essential (primary) hypertension: Secondary | ICD-10-CM | POA: Diagnosis not present

## 2018-06-29 DIAGNOSIS — R439 Unspecified disturbances of smell and taste: Secondary | ICD-10-CM | POA: Diagnosis not present

## 2018-06-29 MED FILL — UNIFINE PENTIPS 8MM 31G: 31G X 8 MM | 50 days supply | Qty: 100 | Fill #0 | Status: TO

## 2018-06-29 MED FILL — TRULICITY 1.5 MG/0.5 ML PEN: 1.5 | 28 days supply | Qty: 2 | Fill #8

## 2018-06-29 MED FILL — ACCU-CHEK GUIDE STRP: 90 days supply | Qty: 300 | Fill #0

## 2018-07-14 DIAGNOSIS — J069 Acute upper respiratory infection, unspecified: Secondary | ICD-10-CM | POA: Diagnosis not present

## 2018-07-14 DIAGNOSIS — S90521S Blister (nonthermal), right ankle, sequela: Secondary | ICD-10-CM | POA: Diagnosis not present

## 2018-07-14 DIAGNOSIS — Z0189 Encounter for other specified special examinations: Secondary | ICD-10-CM | POA: Diagnosis not present

## 2018-07-14 DIAGNOSIS — E6609 Other obesity due to excess calories: Secondary | ICD-10-CM | POA: Diagnosis not present

## 2018-07-14 DIAGNOSIS — M130 Polyarthritis, unspecified: Secondary | ICD-10-CM | POA: Diagnosis not present

## 2018-07-14 DIAGNOSIS — I10 Essential (primary) hypertension: Secondary | ICD-10-CM | POA: Diagnosis not present

## 2018-07-14 DIAGNOSIS — E119 Type 2 diabetes mellitus without complications: Secondary | ICD-10-CM | POA: Diagnosis not present

## 2018-07-14 DIAGNOSIS — E782 Mixed hyperlipidemia: Secondary | ICD-10-CM | POA: Diagnosis not present

## 2018-07-14 MED FILL — HYDROCHLOROTHIAZIDE 25 MG T: 25 | 90 days supply | Qty: 90 | Fill #1 | Status: TO

## 2018-07-14 MED FILL — LANTUS SOLOSTAR 100 UNITS/M: 100 | 40 days supply | Qty: 15 | Fill #1

## 2018-07-14 MED FILL — DOXYCYCLINE HYC 100 MG CAPS: 100 | 10 days supply | Qty: 20 | Fill #0

## 2018-07-14 MED FILL — OSELTAMIVIR PHOSPHATE 75 MG: 75 | 5 days supply | Qty: 10 | Fill #0

## 2018-07-14 MED FILL — metFORMIN HCL 1000 MG TABS: 1000 | 90 days supply | Qty: 180 | Fill #4

## 2018-07-14 MED FILL — EZETIMIBE 10 MG TABS: 10 | 90 days supply | Qty: 90 | Fill #1

## 2018-07-14 MED FILL — NOREL AD TABLET: 4-10-325 | 7 days supply | Qty: 28 | Fill #0

## 2018-07-17 ENCOUNTER — Ambulatory Visit (HOSPITAL_COMMUNITY)
Admission: RE | Admit: 2018-07-17 | Discharge: 2018-07-17 | Disposition: A | Discharge status: Home / Self Care | Payer: 59 | Source: Ambulatory Visit | Attending: Family Medicine | Admitting: Family Medicine

## 2018-07-17 ENCOUNTER — Other Ambulatory Visit: Payer: Self-pay

## 2018-07-17 ENCOUNTER — Other Ambulatory Visit (HOSPITAL_COMMUNITY): Payer: Self-pay | Admitting: Family Medicine

## 2018-07-17 DIAGNOSIS — J189 Pneumonia, unspecified organism: Secondary | ICD-10-CM

## 2018-07-17 DIAGNOSIS — R05 Cough: Secondary | ICD-10-CM | POA: Diagnosis not present

## 2018-07-21 DIAGNOSIS — J069 Acute upper respiratory infection, unspecified: Secondary | ICD-10-CM | POA: Diagnosis not present

## 2018-07-21 MED FILL — TRULICITY 1.5 MG/0.5 ML PEN: 1.5 | 84 days supply | Qty: 6 | Fill #0

## 2018-07-21 MED FILL — VALACYCLOVIR HCL 500 MG TAB: 500 | 10 days supply | Qty: 30 | Fill #0

## 2018-07-25 MED FILL — TELMISARTAN 40 MG TABLET: 40 | 90 days supply | Qty: 90 | Fill #0

## 2018-08-25 MED FILL — JARDIANCE 25 MG TABLET: 25 | 90 days supply | Qty: 90 | Fill #0

## 2018-08-25 MED FILL — UNIFINE PENTIPS 8MM 31G: 31G X 8 MM | 50 days supply | Qty: 100 | Fill #0

## 2018-09-28 ENCOUNTER — Other Ambulatory Visit (HOSPITAL_COMMUNITY): Payer: Self-pay | Admitting: Family Medicine

## 2018-09-28 DIAGNOSIS — Z1231 Encounter for screening mammogram for malignant neoplasm of breast: Secondary | ICD-10-CM

## 2018-09-29 MED FILL — LANTUS SOLOSTAR 100 UNITS/M: 100 | 40 days supply | Qty: 15 | Fill #0

## 2018-10-03 DIAGNOSIS — I10 Essential (primary) hypertension: Secondary | ICD-10-CM | POA: Diagnosis not present

## 2018-10-03 DIAGNOSIS — E669 Obesity, unspecified: Secondary | ICD-10-CM | POA: Diagnosis not present

## 2018-10-03 DIAGNOSIS — E1169 Type 2 diabetes mellitus with other specified complication: Secondary | ICD-10-CM | POA: Diagnosis not present

## 2018-10-12 ENCOUNTER — Ambulatory Visit (HOSPITAL_COMMUNITY)
Admission: RE | Admit: 2018-10-12 | Discharge: 2018-10-12 | Disposition: A | Discharge status: Home / Self Care | Payer: 59 | Source: Ambulatory Visit | Attending: Family Medicine | Admitting: Family Medicine

## 2018-10-12 ENCOUNTER — Other Ambulatory Visit: Payer: Self-pay

## 2018-10-12 DIAGNOSIS — Z1231 Encounter for screening mammogram for malignant neoplasm of breast: Secondary | ICD-10-CM | POA: Diagnosis not present

## 2018-10-17 DIAGNOSIS — E1142 Type 2 diabetes mellitus with diabetic polyneuropathy: Secondary | ICD-10-CM | POA: Diagnosis not present

## 2018-10-17 DIAGNOSIS — B351 Tinea unguium: Secondary | ICD-10-CM | POA: Diagnosis not present

## 2018-10-17 DIAGNOSIS — L851 Acquired keratosis [keratoderma] palmaris et plantaris: Secondary | ICD-10-CM | POA: Diagnosis not present

## 2018-10-17 MED FILL — TRULICITY 1.5 MG/0.5 ML PEN: 1.5 | 84 days supply | Qty: 6 | Fill #1

## 2018-10-17 MED FILL — TELMISARTAN 40 MG TABLET: 40 | 90 days supply | Qty: 90 | Fill #0

## 2018-10-17 MED FILL — SSD 1% CREAM: 1 | 20 days supply | Qty: 50 | Fill #0

## 2018-10-18 MED FILL — metFORMIN HCL 1000 MG TABS: 1000 | 90 days supply | Qty: 180 | Fill #0

## 2018-10-18 MED FILL — EZETIMIBE 10 MG TABS: 10 | 90 days supply | Qty: 90 | Fill #0

## 2018-10-18 MED FILL — HYDROCHLOROTHIAZIDE 25 MG T: 25 | 90 days supply | Qty: 90 | Fill #0 | Status: TO

## 2018-10-20 ENCOUNTER — Other Ambulatory Visit: Payer: Self-pay | Admitting: Internal Medicine

## 2018-10-25 LAB — NOVEL CORONAVIRUS, NAA: SARS-CoV-2, NAA: NOT DETECTED

## 2018-10-26 ENCOUNTER — Telehealth: Payer: Self-pay | Admitting: *Deleted

## 2018-10-26 NOTE — Telephone Encounter (Signed)
-----   Message from Brigitte Pulse, RN sent at 10/25/2018  4:46 PM EDT ----- Regarding: COVID Results These results were from a paper requisition and may need to be called to the patient.    Thank you, Daleen Snook - CHL

## 2018-10-26 NOTE — Telephone Encounter (Signed)
Spoke with patient- confirmed her identity by name and date of birth.  COVID 19 test results from 10/20/2018 given- test results negative.

## 2018-11-15 MED FILL — LANTUS SOLOSTAR 100 UNITS/M: 100 | 40 days supply | Qty: 15 | Fill #1

## 2018-12-22 DIAGNOSIS — S9002XA Contusion of left ankle, initial encounter: Secondary | ICD-10-CM | POA: Diagnosis not present

## 2018-12-22 DIAGNOSIS — E1142 Type 2 diabetes mellitus with diabetic polyneuropathy: Secondary | ICD-10-CM | POA: Diagnosis not present

## 2018-12-22 DIAGNOSIS — B351 Tinea unguium: Secondary | ICD-10-CM | POA: Diagnosis not present

## 2018-12-22 DIAGNOSIS — L851 Acquired keratosis [keratoderma] palmaris et plantaris: Secondary | ICD-10-CM | POA: Diagnosis not present

## 2019-01-09 MED FILL — HYDROCHLOROTHIAZIDE 25 MG T: 25 | 90 days supply | Qty: 90 | Fill #1 | Status: TO

## 2019-01-09 MED FILL — LANTUS SOLOSTAR 100 UNITS/M: 100 | 40 days supply | Qty: 15 | Fill #0

## 2019-01-09 MED FILL — metFORMIN HCL 1000 MG TABS: 1000 | 90 days supply | Qty: 180 | Fill #1

## 2019-01-09 MED FILL — EZETIMIBE 10 MG TABS: 10 | 90 days supply | Qty: 90 | Fill #1

## 2019-01-11 MED FILL — TRULICITY 1.5 MG/0.5 ML PEN: 1.5 | 84 days supply | Qty: 6 | Fill #2

## 2019-01-11 MED FILL — UNIFINE PENTIPS 8MM 31G: 31G X 8 MM | 50 days supply | Qty: 100 | Fill #0

## 2019-01-13 ENCOUNTER — Emergency Department (HOSPITAL_COMMUNITY)
Admission: EM | Admit: 2019-01-13 | Discharge: 2019-01-13 | Disposition: A | Discharge status: Home / Self Care | Payer: 59 | Attending: Emergency Medicine | Admitting: Emergency Medicine

## 2019-01-13 ENCOUNTER — Emergency Department (HOSPITAL_COMMUNITY): Payer: 59

## 2019-01-13 ENCOUNTER — Other Ambulatory Visit: Payer: Self-pay

## 2019-01-13 ENCOUNTER — Encounter (HOSPITAL_COMMUNITY): Payer: Self-pay | Admitting: Emergency Medicine

## 2019-01-13 DIAGNOSIS — Y929 Unspecified place or not applicable: Secondary | ICD-10-CM | POA: Insufficient documentation

## 2019-01-13 DIAGNOSIS — S6992XA Unspecified injury of left wrist, hand and finger(s), initial encounter: Secondary | ICD-10-CM | POA: Diagnosis present

## 2019-01-13 DIAGNOSIS — S61211A Laceration without foreign body of left index finger without damage to nail, initial encounter: Secondary | ICD-10-CM | POA: Diagnosis not present

## 2019-01-13 DIAGNOSIS — Z794 Long term (current) use of insulin: Secondary | ICD-10-CM | POA: Insufficient documentation

## 2019-01-13 DIAGNOSIS — Y939 Activity, unspecified: Secondary | ICD-10-CM | POA: Diagnosis not present

## 2019-01-13 DIAGNOSIS — W260XXA Contact with knife, initial encounter: Secondary | ICD-10-CM | POA: Diagnosis not present

## 2019-01-13 DIAGNOSIS — Z79899 Other long term (current) drug therapy: Secondary | ICD-10-CM | POA: Diagnosis not present

## 2019-01-13 DIAGNOSIS — E119 Type 2 diabetes mellitus without complications: Secondary | ICD-10-CM | POA: Insufficient documentation

## 2019-01-13 DIAGNOSIS — Y999 Unspecified external cause status: Secondary | ICD-10-CM | POA: Insufficient documentation

## 2019-01-13 DIAGNOSIS — Z23 Encounter for immunization: Secondary | ICD-10-CM | POA: Insufficient documentation

## 2019-01-13 MED ORDER — ACETAMINOPHEN 500 MG PO TABS
1000.0000 mg | ORAL_TABLET | Freq: Once | ORAL | Status: AC
  Administered 2019-01-13: 1000 mg via ORAL
  Filled 2019-01-13: qty 2

## 2019-01-13 MED ORDER — TETANUS-DIPHTH-ACELL PERTUSSIS 5-2.5-18.5 LF-MCG/0.5 IM SUSP
0.5000 mL | Freq: Once | INTRAMUSCULAR | Status: AC
  Administered 2019-01-13: 0.5 mL via INTRAMUSCULAR
  Filled 2019-01-13: qty 0.5

## 2019-01-13 NOTE — Discharge Instructions (Addendum)
Your wound was repaired with Dermabond, surgery glue.  This will come off on its own and about 5 to 7 days.  Please do not pull on it.  The flap that was created by the laceration is thin and some areas.  Hopefully this will reattach, but it may slough off and new skin will attach itself.  Please do not apply any lotion or petroleum products to the repaired area, as this will break down the Dermabond.  Please see your primary physician or return to the emergency department if there are any signs of infection such as red streaks going up your hand, pus like drainage from the laceration site, fever that will not respond to Tylenol or ibuprofen, problems, or concerns.

## 2019-01-13 NOTE — ED Triage Notes (Signed)
Pt c/o laceration to LT index finger. Pt cut it with knife. Pt unsure when last Tdap vaccine was. Dressing applied by patient. Bleeding controlled at this time.

## 2019-01-13 NOTE — ED Provider Notes (Signed)
[facility] EMERGENCY DEPARTMENT Provider Note   CSN: [id number] Arrival date & time: 01/13/19  1837     History   Chief Complaint Chief Complaint  Patient presents with  . Laceration    HPI Amanda Estes is a 73 y.o. female.     Patient with a complaint of  Patient states that she was using a knife today when she cut her left index finger.  She reports pressure, she put a tight bandage around it but it continued to bleed so she came into the emergency department for evaluation.  Patient also states that she is not sure of exactly when her last tetanus was given.  No other injury reported.  The patient denies being on any anticoagulation medications.  No recent operations or procedures involving the index finger.  The history is provided by the patient.  Laceration Associated symptoms: no rash     Past Medical History:  Diagnosis Date  . Diabetes mellitus   . Hyperlipidemia   . Obesity     Patient Active Problem List   Diagnosis Date Noted  . Diabetes ([place]) 03/05/2013    Past Surgical History:  Procedure Laterality Date  . CATARACT EXTRACTION Right   . CATARACT EXTRACTION W/PHACO Left 04/04/2014   Procedure: CATARACT EXTRACTION PHACO AND INTRAOCULAR LENS PLACEMENT LEFT EYE;  Surgeon: Tonny Branch, MD;  Location: [facility];  Service: Ophthalmology;  Laterality: Left;  CDE: 13.36  . EYE SURGERY    . MYOMECTOMY  2005     OB History   No obstetric history on file.      Home Medications    Prior to Admission medications   Medication Sig Start Date End Date Taking? Authorizing Provider  Canagliflozin (INVOKANA) 300 MG TABS Take 300 mg by mouth daily before breakfast.     [provider]  dorzolamide (TRUSOPT) 2 % ophthalmic solution 1 drop 3 (three) times daily.    [provider]  ezetimibe (ZETIA) 10 MG tablet Take 10 mg by mouth daily.    [provider]  hydrochlorothiazide (HYDRODIURIL) 25 MG tablet Take 25 mg by mouth daily.     [provider]  insulin glargine (LANTUS) 100 UNIT/ML injection Inject 36 Units into the skin at bedtime.     [provider]  linagliptin (TRADJENTA) 5 MG TABS tablet Take 5 mg by mouth every evening.     [provider]  metFORMIN (GLUCOPHAGE) 1000 MG tablet Take 1,000 mg by mouth 2 (two) times daily with a meal.     [provider]  mometasone (NASONEX) 50 MCG/ACT nasal spray Place 2 sprays into the nose daily as needed.     [provider]  naproxen (NAPROSYN) 250 MG tablet Take 1 tablet (250 mg total) by mouth 2 (two) times daily with a meal. 01/16/18   Burky, Lanelle Bal B, NP  telmisartan (MICARDIS) 40 MG tablet Take 40 mg by mouth daily.    [provider]  valsartan (DIOVAN) 40 MG tablet Take 40 mg by mouth daily.    [provider]    Family History Family History  Problem Relation Age of Onset  . Cancer Other   . Obesity Other     Social History Social History   Tobacco Use  . Smoking status: Never Smoker  . Smokeless tobacco: Never Used  Substance Use Topics  . Alcohol use: No  . Drug use: No     Allergies   Augmentin [amoxicillin-pot clavulanate] and Fish allergy  Review of Systems Review of Systems  Constitutional: Negative for activity change and appetite change.  HENT: Negative for congestion, ear discharge, ear pain, facial swelling, nosebleeds, rhinorrhea, sneezing and tinnitus.   Eyes: Negative for photophobia, pain and discharge.  Respiratory: Negative for cough, choking, shortness of breath and wheezing.   Cardiovascular: Negative for chest pain, palpitations and leg swelling.  Gastrointestinal: Negative for abdominal pain, blood in stool, constipation, diarrhea, nausea and vomiting.  Genitourinary: Negative for difficulty urinating, dysuria, flank pain, frequency and hematuria.  Musculoskeletal: Negative for back pain, gait problem, myalgias and neck pain.  Skin: Negative for color change,  rash and wound.  Neurological: Negative for dizziness, seizures, syncope, facial asymmetry, speech difficulty, weakness and numbness.  Hematological: Negative for adenopathy. Does not bruise/bleed easily.  Psychiatric/Behavioral: Negative for agitation, confusion, hallucinations, self-injury and suicidal ideas. The patient is not nervous/anxious.      Physical Exam Updated Vital Signs BP (!) 148/64 (BP Location: Right Arm)   Pulse 66   Temp 98.7 F (37.1 C) (Oral)   Resp 14   Ht 5' (1.524 m)   Wt 78 kg   SpO2 98%   BMI 33.59 kg/m   Physical Exam Vitals signs and nursing note reviewed.  Constitutional:      Appearance: She is well-developed. She is not toxic-appearing.  HENT:     Head: Normocephalic.     Right Ear: Tympanic membrane and external ear normal.     Left Ear: Tympanic membrane and external ear normal.  Eyes:     General: Lids are normal.     Pupils: Pupils are equal, round, and reactive to light.  Neck:     Musculoskeletal: Normal range of motion and neck supple.     Vascular: No carotid bruit.  Cardiovascular:     Rate and Rhythm: Normal rate and regular rhythm.     Pulses: Normal pulses.     Heart sounds: Normal heart sounds.  Pulmonary:     Effort: No respiratory distress.     Breath sounds: Normal breath sounds.  Abdominal:     General: Bowel sounds are normal.     Palpations: Abdomen is soft.     Tenderness: There is no abdominal tenderness. There is no guarding.  Musculoskeletal: Normal range of motion.     Left hand: She exhibits laceration. Normal sensation noted. Normal strength noted.       Hands:  Lymphadenopathy:     Head:     Right side of head: No submandibular adenopathy.     Left side of head: No submandibular adenopathy.     Cervical: No cervical adenopathy.  Skin:    General: Skin is warm and dry.  Neurological:     Mental Status: She is alert and oriented to person, place, and time.     Cranial Nerves: No cranial nerve deficit.      Sensory: No sensory deficit.  Psychiatric:        Speech: Speech normal.      ED Treatments / Results  Labs (all labs ordered are listed, but only abnormal results are displayed) Labs Reviewed - No data to display  EKG None  Radiology Dg Finger Index Left  Result Date: 01/13/2019 CLINICAL DATA:  Laceration EXAM: LEFT INDEX FINGER 2+V COMPARISON:  None. FINDINGS: Soft tissue swelling about the first digit. Laceration seen at the ulnar base of the first digit. No soft tissue gas or foreign body. No acute osseous injury or traumatic malalignment. IMPRESSION: Soft tissue  swelling and laceration of the first digit without foreign body or acute osseous abnormality. Electronically Signed   By: Lovena Le M.D.   On: 01/13/2019 19:10    Procedures .Marland KitchenLaceration Repair  Date/Time: 01/13/2019 8:03 PM Performed by: Lily Kocher, PA-C Authorized by: Lily Kocher, PA-C   Consent:    Consent obtained:  Verbal   Consent given by:  Patient   Risks discussed:  Infection, poor cosmetic result and poor wound healing Universal protocol:    Procedure explained and questions answered to patient or proxy's satisfaction: yes     Immediately prior to procedure, a time out was called: yes     Patient identity confirmed:  Arm band Laceration details:    Location:  Finger   Finger location:  L index finger   Length (cm):  1.1 Repair type:    Repair type:  Simple Pre-procedure details:    Preparation:  Patient was prepped and draped in usual sterile fashion and imaging obtained to evaluate for foreign bodies Exploration:    Hemostasis achieved with:  Direct pressure (WoundSeal)   Wound extent: no foreign bodies/material noted, no tendon damage noted and no underlying fracture noted     Contaminated: no   Treatment:    Area cleansed with:  Soap and water   Amount of cleaning:  Standard   Irrigation solution:  Tap water Skin repair:    Repair method:  Tissue adhesive Approximation:     Approximation:  Loose Post-procedure details:    Dressing:  Open (no dressing)   Patient tolerance of procedure:  Tolerated well, no immediate complications   (including critical care time)  Medications Ordered in ED Medications  Tdap (BOOSTRIX) injection 0.5 mL (has no administration in time range)  acetaminophen (TYLENOL) tablet 1,000 mg (has no administration in time range)     Initial Impression / Assessment and Plan / ED Course  I have reviewed the triage vital signs and the nursing notes.  Pertinent labs & imaging results that were available during my care of the patient were reviewed by me and considered in my medical decision making (see chart for details).          Final Clinical Impressions(s) / ED Diagnoses MDM Blood pressure is slightly elevated.  Vital signs are otherwise within normal limits.  X-ray of the finger shows tissue swelling and laceration of the index finger, but no fracture and no dislocation.  X-ray was reviewed by me.  The wound to the distal tip of the left index finger was repaired with Dermabond.  Tetanus status is updated at this time.  Patient given instructions to return if any signs of advancing infection, problems, or concerns.  Patient in agreement with this plan.      Final diagnoses:  Laceration of left index finger without foreign body without damage to nail, initial encounter    ED Discharge Orders    None       Lily Kocher, Hershal Coria 01/13/19 2011    Fredia Sorrow, MD 01/27/19 [phone]

## 2019-01-26 DIAGNOSIS — S60411A Abrasion of left index finger, initial encounter: Secondary | ICD-10-CM | POA: Diagnosis not present

## 2019-02-05 DIAGNOSIS — E119 Type 2 diabetes mellitus without complications: Secondary | ICD-10-CM | POA: Diagnosis not present

## 2019-02-05 DIAGNOSIS — Z961 Presence of intraocular lens: Secondary | ICD-10-CM | POA: Diagnosis not present

## 2019-02-06 DIAGNOSIS — E1169 Type 2 diabetes mellitus with other specified complication: Secondary | ICD-10-CM | POA: Diagnosis not present

## 2019-02-06 DIAGNOSIS — S60411D Abrasion of left index finger, subsequent encounter: Secondary | ICD-10-CM | POA: Diagnosis not present

## 2019-02-06 DIAGNOSIS — I10 Essential (primary) hypertension: Secondary | ICD-10-CM | POA: Diagnosis not present

## 2019-02-13 DIAGNOSIS — Z1211 Encounter for screening for malignant neoplasm of colon: Secondary | ICD-10-CM | POA: Diagnosis not present

## 2019-02-13 MED FILL — PEG-3350 AND ELECTROLYTES S: 236 | 1 days supply | Qty: 4000 | Fill #0

## 2019-02-13 MED FILL — TELMISARTAN 40 MG TABLET: 40 | 90 days supply | Qty: 90 | Fill #0

## 2019-02-14 MED FILL — JARDIANCE 25 MG TABLET: 25 | 90 days supply | Qty: 90 | Fill #0

## 2019-03-02 MED FILL — LANTUS SOLOSTAR 100 UNITS/M: 100 | 40 days supply | Qty: 15 | Fill #1

## 2019-03-16 DIAGNOSIS — E1142 Type 2 diabetes mellitus with diabetic polyneuropathy: Secondary | ICD-10-CM | POA: Diagnosis not present

## 2019-03-16 DIAGNOSIS — B351 Tinea unguium: Secondary | ICD-10-CM | POA: Diagnosis not present

## 2019-03-16 DIAGNOSIS — L851 Acquired keratosis [keratoderma] palmaris et plantaris: Secondary | ICD-10-CM | POA: Diagnosis not present

## 2019-03-19 ENCOUNTER — Other Ambulatory Visit: Payer: Self-pay | Admitting: Cardiology

## 2019-03-19 DIAGNOSIS — Z20822 Contact with and (suspected) exposure to covid-19: Secondary | ICD-10-CM

## 2019-03-19 MED FILL — ACCU-CHEK GUIDE STRP: 50 days supply | Qty: 100 | Fill #0

## 2019-03-19 MED FILL — UNIFINE PENTIPS 8MM 31G: 31G X 8 MM | 50 days supply | Qty: 100 | Fill #0

## 2019-03-21 LAB — NOVEL CORONAVIRUS, NAA: SARS-CoV-2, NAA: NOT DETECTED

## 2019-03-26 DIAGNOSIS — K635 Polyp of colon: Secondary | ICD-10-CM | POA: Diagnosis not present

## 2019-03-26 DIAGNOSIS — D122 Benign neoplasm of ascending colon: Secondary | ICD-10-CM | POA: Diagnosis not present

## 2019-03-26 DIAGNOSIS — Z1211 Encounter for screening for malignant neoplasm of colon: Secondary | ICD-10-CM | POA: Diagnosis not present

## 2019-04-14 MED FILL — LANTUS SOLOSTAR 100 UNITS/M: 100 | 40 days supply | Qty: 15 | Fill #0

## 2019-04-14 MED FILL — HYDROCHLOROTHIAZIDE 25 MG T: 25 | 90 days supply | Qty: 90 | Fill #0 | Status: TO

## 2019-04-14 MED FILL — EZETIMIBE 10 MG TABS: 10 | 90 days supply | Qty: 90 | Fill #0

## 2019-04-14 MED FILL — metFORMIN HCL 1000 MG TABS: 1000 | 90 days supply | Qty: 180 | Fill #0

## 2019-04-16 MED FILL — TRULICITY 1.5 MG/0.5 ML PEN: 1.5 | 84 days supply | Qty: 6 | Fill #0

## 2019-04-30 MED FILL — ACCU-CHEK GUIDE STRP: 90 days supply | Qty: 200 | Fill #0

## 2019-04-30 MED FILL — UNIFINE PENTIPS 8MM 31G: 31G X 8 MM | 50 days supply | Qty: 100 | Fill #0

## 2019-05-17 MED FILL — TELMISARTAN 40 MG TABLET: 40 | 90 days supply | Qty: 90 | Fill #0

## 2019-06-08 DIAGNOSIS — E1142 Type 2 diabetes mellitus with diabetic polyneuropathy: Secondary | ICD-10-CM | POA: Diagnosis not present

## 2019-06-08 DIAGNOSIS — L851 Acquired keratosis [keratoderma] palmaris et plantaris: Secondary | ICD-10-CM | POA: Diagnosis not present

## 2019-06-08 DIAGNOSIS — R109 Unspecified abdominal pain: Secondary | ICD-10-CM | POA: Diagnosis not present

## 2019-06-08 DIAGNOSIS — I10 Essential (primary) hypertension: Secondary | ICD-10-CM | POA: Diagnosis not present

## 2019-06-08 DIAGNOSIS — B351 Tinea unguium: Secondary | ICD-10-CM | POA: Diagnosis not present

## 2019-06-13 MED FILL — LANTUS SOLOSTAR 100 UNITS/M: 100 | 86 days supply | Qty: 33 | Fill #0

## 2019-07-13 MED FILL — HYDROCHLOROTHIAZIDE 25 MG T: 25 | 90 days supply | Qty: 90 | Fill #1 | Status: TO

## 2019-07-13 MED FILL — EZETIMIBE 10 MG TABS: 10 | 90 days supply | Qty: 90 | Fill #1

## 2019-07-13 MED FILL — TRULICITY 1.5 MG/0.5 ML PEN: 1.5 | 84 days supply | Qty: 6 | Fill #1

## 2019-07-13 MED FILL — metFORMIN HCL 1000 MG TABS: 1000 | 90 days supply | Qty: 180 | Fill #1

## 2019-08-14 MED FILL — TELMISARTAN 40 MG TABLET: 40 | 90 days supply | Qty: 90 | Fill #1

## 2019-08-17 DIAGNOSIS — E1142 Type 2 diabetes mellitus with diabetic polyneuropathy: Secondary | ICD-10-CM | POA: Diagnosis not present

## 2019-08-17 DIAGNOSIS — L851 Acquired keratosis [keratoderma] palmaris et plantaris: Secondary | ICD-10-CM | POA: Diagnosis not present

## 2019-08-17 DIAGNOSIS — B351 Tinea unguium: Secondary | ICD-10-CM | POA: Diagnosis not present

## 2019-08-28 ENCOUNTER — Other Ambulatory Visit (HOSPITAL_COMMUNITY): Payer: Self-pay | Admitting: Family Medicine

## 2019-08-28 DIAGNOSIS — Z1231 Encounter for screening mammogram for malignant neoplasm of breast: Secondary | ICD-10-CM

## 2019-09-27 MED FILL — LANTUS SOLOSTAR 100 UNITS/M: 100 | 86 days supply | Qty: 33 | Fill #1

## 2019-10-08 DIAGNOSIS — M130 Polyarthritis, unspecified: Secondary | ICD-10-CM | POA: Diagnosis not present

## 2019-10-08 DIAGNOSIS — I10 Essential (primary) hypertension: Secondary | ICD-10-CM | POA: Diagnosis not present

## 2019-10-08 DIAGNOSIS — E1169 Type 2 diabetes mellitus with other specified complication: Secondary | ICD-10-CM | POA: Diagnosis not present

## 2019-10-08 DIAGNOSIS — E782 Mixed hyperlipidemia: Secondary | ICD-10-CM | POA: Diagnosis not present

## 2019-10-11 ENCOUNTER — Other Ambulatory Visit (HOSPITAL_COMMUNITY): Payer: Self-pay | Admitting: Family Medicine

## 2019-10-15 ENCOUNTER — Other Ambulatory Visit (HOSPITAL_COMMUNITY): Payer: Self-pay | Admitting: Family Medicine

## 2019-10-22 ENCOUNTER — Other Ambulatory Visit: Payer: Self-pay

## 2019-10-22 ENCOUNTER — Ambulatory Visit (HOSPITAL_COMMUNITY)
Admission: RE | Admit: 2019-10-22 | Discharge: 2019-10-22 | Disposition: A | Discharge status: Home / Self Care | Payer: PPO | Source: Ambulatory Visit | Attending: Family Medicine | Admitting: Family Medicine

## 2019-10-22 DIAGNOSIS — Z1231 Encounter for screening mammogram for malignant neoplasm of breast: Secondary | ICD-10-CM | POA: Insufficient documentation

## 2019-11-02 DIAGNOSIS — B351 Tinea unguium: Secondary | ICD-10-CM | POA: Diagnosis not present

## 2019-11-02 DIAGNOSIS — L851 Acquired keratosis [keratoderma] palmaris et plantaris: Secondary | ICD-10-CM | POA: Diagnosis not present

## 2019-11-02 DIAGNOSIS — E1142 Type 2 diabetes mellitus with diabetic polyneuropathy: Secondary | ICD-10-CM | POA: Diagnosis not present

## 2019-11-07 ENCOUNTER — Other Ambulatory Visit (HOSPITAL_COMMUNITY): Payer: Self-pay | Admitting: Family Medicine

## 2019-11-07 MED FILL — TELMISARTAN 40 MG TABS: 40 | 90 days supply | Qty: 90 | Fill #0

## 2019-11-08 ENCOUNTER — Other Ambulatory Visit (HOSPITAL_COMMUNITY): Payer: Self-pay | Admitting: Family Medicine

## 2019-11-27 ENCOUNTER — Other Ambulatory Visit: Payer: Self-pay

## 2019-11-29 MED FILL — TRULICITY 1.5 MG/0.5 ML PEN: 1.5 | 28 days supply | Qty: 2 | Fill #3

## 2020-01-01 ENCOUNTER — Other Ambulatory Visit (HOSPITAL_COMMUNITY): Payer: Self-pay | Admitting: Family Medicine

## 2020-01-01 MED FILL — metFORMIN HCL 1000 MG TABS: 1000 | 90 days supply | Qty: 180 | Fill #0

## 2020-01-01 MED FILL — LANTUS SOLOSTAR 100 UNITS/M: 100 | 62 days supply | Qty: 24 | Fill #2

## 2020-01-01 MED FILL — HYDROCHLOROTHIAZIDE 25 MG T: 25 | 90 days supply | Qty: 90 | Fill #0 | Status: TO

## 2020-01-01 MED FILL — UNIFINE PENTIPS 31GX3/16: 31G X 5 MM | 90 days supply | Qty: 100 | Fill #0

## 2020-01-01 MED FILL — EZETIMIBE 10 MG TABS: 10 | 90 days supply | Qty: 90 | Fill #0

## 2020-01-11 DIAGNOSIS — L851 Acquired keratosis [keratoderma] palmaris et plantaris: Secondary | ICD-10-CM | POA: Diagnosis not present

## 2020-01-11 DIAGNOSIS — B351 Tinea unguium: Secondary | ICD-10-CM | POA: Diagnosis not present

## 2020-01-11 DIAGNOSIS — E1142 Type 2 diabetes mellitus with diabetic polyneuropathy: Secondary | ICD-10-CM | POA: Diagnosis not present

## 2020-01-15 DIAGNOSIS — E118 Type 2 diabetes mellitus with unspecified complications: Secondary | ICD-10-CM | POA: Diagnosis not present

## 2020-01-15 DIAGNOSIS — I10 Essential (primary) hypertension: Secondary | ICD-10-CM | POA: Diagnosis not present

## 2020-01-15 DIAGNOSIS — R10812 Left upper quadrant abdominal tenderness: Secondary | ICD-10-CM | POA: Diagnosis not present

## 2020-01-15 DIAGNOSIS — E1169 Type 2 diabetes mellitus with other specified complication: Secondary | ICD-10-CM | POA: Diagnosis not present

## 2020-02-21 MED FILL — LANTUS SOLOSTAR 100 UNITS/M: 100 | 40 days supply | Qty: 15 | Fill #1

## 2020-02-21 MED FILL — TELMISARTAN 40 MG TABS: 40 | 90 days supply | Qty: 90 | Fill #1

## 2020-02-22 ENCOUNTER — Other Ambulatory Visit (HOSPITAL_COMMUNITY): Payer: Self-pay | Admitting: Family Medicine

## 2020-02-22 MED FILL — UNIFINE PENTIPS 31GX3/16: 31G X 5 MM | 90 days supply | Qty: 100 | Fill #0

## 2020-02-26 ENCOUNTER — Ambulatory Visit: Payer: PPO | Attending: Internal Medicine

## 2020-02-26 DIAGNOSIS — Z23 Encounter for immunization: Secondary | ICD-10-CM

## 2020-02-26 NOTE — Progress Notes (Signed)
   Covid-19 Vaccination Clinic  Name:  Amanda Estes    MRN: [id number] DOB: 1945/10/19  02/26/2020  Amanda Estes was observed post Covid-19 immunization for 15 minutes without incident. She was provided with Vaccine Information Sheet and instruction to access the V-Safe system.   Amanda Estes was instructed to call [phone] with any severe reactions post vaccine: Marland Kitchen Difficulty breathing  . Swelling of face and throat  . A fast heartbeat  . A bad rash all over body  . Dizziness and weakness

## 2020-03-18 ENCOUNTER — Other Ambulatory Visit: Payer: PPO

## 2020-03-18 ENCOUNTER — Other Ambulatory Visit: Payer: Self-pay | Admitting: *Deleted

## 2020-03-18 DIAGNOSIS — Z20822 Contact with and (suspected) exposure to covid-19: Secondary | ICD-10-CM

## 2020-03-18 MED FILL — TRULICITY 1.5 MG/0.5 ML PEN: 1.5 | 28 days supply | Qty: 2 | Fill #4

## 2020-03-19 LAB — NOVEL CORONAVIRUS, NAA: SARS-CoV-2, NAA: NOT DETECTED

## 2020-03-19 LAB — SARS-COV-2, NAA 2 DAY TAT

## 2020-03-21 DIAGNOSIS — B351 Tinea unguium: Secondary | ICD-10-CM | POA: Diagnosis not present

## 2020-03-21 DIAGNOSIS — L851 Acquired keratosis [keratoderma] palmaris et plantaris: Secondary | ICD-10-CM | POA: Diagnosis not present

## 2020-03-21 DIAGNOSIS — E1142 Type 2 diabetes mellitus with diabetic polyneuropathy: Secondary | ICD-10-CM | POA: Diagnosis not present

## 2020-03-24 ENCOUNTER — Other Ambulatory Visit (HOSPITAL_COMMUNITY): Payer: Self-pay | Admitting: Family Medicine

## 2020-03-31 ENCOUNTER — Other Ambulatory Visit: Payer: PPO

## 2020-03-31 DIAGNOSIS — Z20822 Contact with and (suspected) exposure to covid-19: Secondary | ICD-10-CM

## 2020-04-01 DIAGNOSIS — I10 Essential (primary) hypertension: Secondary | ICD-10-CM | POA: Diagnosis not present

## 2020-04-01 DIAGNOSIS — E7849 Other hyperlipidemia: Secondary | ICD-10-CM | POA: Diagnosis not present

## 2020-04-01 DIAGNOSIS — M130 Polyarthritis, unspecified: Secondary | ICD-10-CM | POA: Diagnosis not present

## 2020-04-01 DIAGNOSIS — E1169 Type 2 diabetes mellitus with other specified complication: Secondary | ICD-10-CM | POA: Diagnosis not present

## 2020-04-01 LAB — NOVEL CORONAVIRUS, NAA: SARS-CoV-2, NAA: NOT DETECTED

## 2020-04-01 LAB — SARS-COV-2, NAA 2 DAY TAT

## 2020-04-08 MED FILL — EZETIMIBE 10 MG TABS: 10 | 90 days supply | Qty: 90 | Fill #1

## 2020-04-08 MED FILL — TRULICITY 1.5 MG/0.5 ML PEN: 1.5 | 28 days supply | Qty: 2 | Fill #0

## 2020-04-08 MED FILL — LANTUS SOLOSTAR 100 UNITS/M: 100 | 39 days supply | Qty: 15 | Fill #0

## 2020-04-08 MED FILL — HYDROCHLOROTHIAZIDE 25 MG T: 25 | 90 days supply | Qty: 90 | Fill #1 | Status: TO

## 2020-04-08 MED FILL — METFORMIN HCL 1000 MG TABS: 1000 | 90 days supply | Qty: 180 | Fill #1

## 2020-05-09 MED FILL — TRULICITY 1.5 MG/0.5 ML PEN: 1.5 | 28 days supply | Qty: 2 | Fill #1

## 2020-05-12 MED FILL — TELMISARTAN 40 MG TABS: 40 | 90 days supply | Qty: 90 | Fill #0

## 2020-05-12 MED FILL — UNIFINE PENTIPS 31GX3/16: 31G X 5 MM | 90 days supply | Qty: 100 | Fill #1

## 2020-05-16 DIAGNOSIS — E7849 Other hyperlipidemia: Secondary | ICD-10-CM | POA: Diagnosis not present

## 2020-05-16 DIAGNOSIS — E118 Type 2 diabetes mellitus with unspecified complications: Secondary | ICD-10-CM | POA: Diagnosis not present

## 2020-05-16 DIAGNOSIS — E1169 Type 2 diabetes mellitus with other specified complication: Secondary | ICD-10-CM | POA: Diagnosis not present

## 2020-05-26 MED FILL — LANTUS SOLOSTAR 100 UNITS/M: 100 | 39 days supply | Qty: 15 | Fill #1

## 2020-05-29 ENCOUNTER — Other Ambulatory Visit: Payer: Self-pay

## 2020-05-29 DIAGNOSIS — Z20822 Contact with and (suspected) exposure to covid-19: Secondary | ICD-10-CM

## 2020-05-30 DIAGNOSIS — L851 Acquired keratosis [keratoderma] palmaris et plantaris: Secondary | ICD-10-CM | POA: Diagnosis not present

## 2020-05-30 DIAGNOSIS — E1142 Type 2 diabetes mellitus with diabetic polyneuropathy: Secondary | ICD-10-CM | POA: Diagnosis not present

## 2020-05-30 DIAGNOSIS — B351 Tinea unguium: Secondary | ICD-10-CM | POA: Diagnosis not present

## 2020-05-30 LAB — NOVEL CORONAVIRUS, NAA: SARS-CoV-2, NAA: NOT DETECTED

## 2020-05-30 LAB — SARS-COV-2, NAA 2 DAY TAT

## 2020-07-04 MED FILL — METFORMIN HCL 1000 MG TABS: 1000 | 90 days supply | Qty: 180 | Fill #1

## 2020-07-04 MED FILL — HYDROCHLOROTHIAZIDE 25 MG T: 25 | 90 days supply | Qty: 90 | Fill #1

## 2020-07-04 MED FILL — EZETIMIBE 10 MG TABS: 10 | 90 days supply | Qty: 90 | Fill #1

## 2020-07-04 MED FILL — LANTUS SOLOSTAR 100 UNITS/M: 100 | 39 days supply | Qty: 15 | Fill #2

## 2020-07-04 MED FILL — TRULICITY 1.5 MG/0.5 ML PEN: 1.5 | 28 days supply | Qty: 2 | Fill #2

## 2020-08-11 ENCOUNTER — Other Ambulatory Visit (HOSPITAL_COMMUNITY): Payer: Self-pay

## 2020-08-11 ENCOUNTER — Other Ambulatory Visit (HOSPITAL_COMMUNITY): Payer: Self-pay | Admitting: Family Medicine

## 2020-08-11 MED ORDER — INSULIN PEN NEEDLE 31G X 8 MM MISC
100.0000 | 5 refills | Status: DC
  Filled 2020-08-11: qty 100, 50d supply, fill #0
  Filled 2020-12-10 – 2021-04-01 (×2): qty 100, 50d supply, fill #1
  Filled 2021-08-05: qty 100, 50d supply, fill #2

## 2020-08-11 MED FILL — Insulin Glargine Soln Pen-Injector 100 Unit/ML: SUBCUTANEOUS | 87 days supply | Qty: 33 | Fill #0 | Status: AC

## 2020-08-11 MED FILL — Dulaglutide Soln Auto-injector 1.5 MG/0.5ML: SUBCUTANEOUS | 28 days supply | Qty: 2 | Fill #0 | Status: AC

## 2020-08-11 MED FILL — Telmisartan Tab 40 MG: ORAL | 90 days supply | Qty: 90 | Fill #0 | Status: AC

## 2020-08-12 ENCOUNTER — Other Ambulatory Visit (HOSPITAL_COMMUNITY): Payer: Self-pay

## 2020-08-15 DIAGNOSIS — E1142 Type 2 diabetes mellitus with diabetic polyneuropathy: Secondary | ICD-10-CM | POA: Diagnosis not present

## 2020-08-15 DIAGNOSIS — L84 Corns and callosities: Secondary | ICD-10-CM | POA: Diagnosis not present

## 2020-08-15 DIAGNOSIS — B351 Tinea unguium: Secondary | ICD-10-CM | POA: Diagnosis not present

## 2020-08-19 ENCOUNTER — Ambulatory Visit
Admission: EM | Admit: 2020-08-19 | Discharge: 2020-08-19 | Disposition: A | Discharge status: Home / Self Care | Payer: HMO | Attending: Family Medicine | Admitting: Family Medicine

## 2020-08-19 ENCOUNTER — Ambulatory Visit (INDEPENDENT_AMBULATORY_CARE_PROVIDER_SITE_OTHER): Payer: HMO

## 2020-08-19 ENCOUNTER — Other Ambulatory Visit: Payer: Self-pay

## 2020-08-19 ENCOUNTER — Encounter: Payer: Self-pay | Admitting: Emergency Medicine

## 2020-08-19 DIAGNOSIS — M25511 Pain in right shoulder: Secondary | ICD-10-CM

## 2020-08-19 DIAGNOSIS — S43401A Unspecified sprain of right shoulder joint, initial encounter: Secondary | ICD-10-CM

## 2020-08-19 DIAGNOSIS — W19XXXA Unspecified fall, initial encounter: Secondary | ICD-10-CM

## 2020-08-19 NOTE — Discharge Instructions (Addendum)
May continue with tylenol as needed for pain  May continue with light exercise as tolerated  Xray was negative for fracture or misalignment today  Follow up with sports medicine/orthopedics for further imaging if symptoms are persisting

## 2020-08-19 NOTE — ED Provider Notes (Signed)
[place]   [id number] 08/19/20 Arrival Time: 1030  PP:IRJJO PAIN  SUBJECTIVE: History from: patient. Amanda Estes is a 75 y.o. female complains of R shoulder pain that began 2 days ago. Reports that she tripped over a suitcase in the hall and awkwardly caught herself. Localizes the pain to the anterior R shoulder. Describes the pain as intermittent and achy in character. Has tried OTC medications with relief. Symptoms are made worse with activity. Denies similar symptoms in the past. Denies fever, chills, erythema, ecchymosis, effusion, weakness, numbness and tingling, saddle paresthesias, loss of bowel or bladder function.      ROS: As per HPI.  All other pertinent ROS negative.     Past Medical History:  Diagnosis Date  . Diabetes mellitus   . Hyperlipidemia   . Obesity    Past Surgical History:  Procedure Laterality Date  . CATARACT EXTRACTION Right   . CATARACT EXTRACTION W/PHACO Left 04/04/2014   Procedure: CATARACT EXTRACTION PHACO AND INTRAOCULAR LENS PLACEMENT LEFT EYE;  Surgeon: Tonny Branch, MD;  Location: [facility];  Service: Ophthalmology;  Laterality: Left;  CDE: 13.36  . EYE SURGERY    . MYOMECTOMY  2005   Allergies  Allergen Reactions  . Augmentin [Amoxicillin-Pot Clavulanate] Shortness Of Breath  . Fish Allergy Anaphylaxis    [place]    No current facility-administered medications on file prior to encounter.   Current Outpatient Medications on File Prior to Encounter  Medication Sig Dispense Refill  . Canagliflozin (INVOKANA) 300 MG TABS Take 300 mg by mouth daily before breakfast.     . dorzolamide (TRUSOPT) 2 % ophthalmic solution 1 drop 3 (three) times daily.    . Dulaglutide 1.5 MG/0.5ML SOPN INJECT 1 PEN UNDER THE SKIN ONCE A WEEK 6 mL 2  . ezetimibe (ZETIA) 10 MG tablet Take 10 mg by mouth daily.    Marland Kitchen ezetimibe (ZETIA) 10 MG tablet TAKE 1 TABLET BY MOUTH DAILY 90 tablet 1  . ezetimibe (ZETIA) 10 MG tablet TAKE 1 TABLET BY MOUTH ONCE DAILY 90  tablet 1  . hydrochlorothiazide (HYDRODIURIL) 25 MG tablet Take 25 mg by mouth daily.    . hydrochlorothiazide (HYDRODIURIL) 25 MG tablet TAKE 1 TABLET BY MOUTH ONCE A DAY 90 tablet 1  . insulin glargine (LANTUS) 100 UNIT/ML injection Inject 36 Units into the skin at bedtime.     . insulin glargine (LANTUS) 100 UNIT/ML Solostar Pen INJECT 38 UNITS INTO THE SKIN AT BEDTIME. 45 mL 1  . Insulin Pen Needle 31G X 5 MM MISC USE AS DIRECTED DAILY WITH LANTUS 100 each 1  . Insulin Pen Needle 31G X 8 MM MISC Use to give insulin as directed twice daily. 100 each 5  . linagliptin (TRADJENTA) 5 MG TABS tablet Take 5 mg by mouth every evening.     . metFORMIN (GLUCOPHAGE) 1000 MG tablet Take 1,000 mg by mouth 2 (two) times daily with a meal.     . metFORMIN (GLUCOPHAGE) 1000 MG tablet TAKE 2 TABLETS BY MOUTH EVERY EVENING 180 tablet 1  . metFORMIN (GLUCOPHAGE) 1000 MG tablet TAKE 2 TABLETS BY MOUTH EVERY EVENING 180 tablet 3  . mometasone (NASONEX) 50 MCG/ACT nasal spray Place 2 sprays into the nose daily as needed.     . naproxen (NAPROSYN) 250 MG tablet Take 1 tablet (250 mg total) by mouth 2 (two) times daily with a meal. 20 tablet 0  . telmisartan (MICARDIS) 40 MG tablet Take 40 mg by mouth daily.    Marland Kitchen  telmisartan (MICARDIS) 40 MG tablet TAKE 1 TABLET BY MOUTH ONCE DAILY 90 tablet 1  . telmisartan (MICARDIS) 40 MG tablet TAKE 1 TABLET BY MOUTH ONCE DAILY. 90 tablet 1  . valsartan (DIOVAN) 40 MG tablet Take 40 mg by mouth daily.     Social History   Socioeconomic History  . Marital status: Single    Spouse name: Not on file  . Number of children: Not on file  . Years of education: Not on file  . Highest education level: Not on file  Occupational History  . Not on file  Tobacco Use  . Smoking status: Never Smoker  . Smokeless tobacco: Never Used  Vaping Use  . Vaping Use: Never used  Substance and Sexual Activity  . Alcohol use: No  . Drug use: No  . Sexual activity: Yes    Birth  control/protection: Post-menopausal  Other Topics Concern  . Not on file  Social History Narrative  . Not on file   Social Determinants of Health   Financial Resource Strain: Not on file  Food Insecurity: Not on file  Transportation Needs: Not on file  Physical Activity: Not on file  Stress: Not on file  Social Connections: Not on file  Intimate Partner Violence: Not on file   Family History  Problem Relation Age of Onset  . Cancer Other   . Obesity Other     OBJECTIVE:  Vitals:   08/19/20 1049 08/19/20 1050  BP:  (!) 154/72  Pulse: 70   Resp: 18   Temp: 98.2 F (36.8 C)   SpO2: 97%     General appearance: ALERT; in no acute distress.  Head: NCAT Lungs: Normal respiratory effort CV: pulses 2+ bilaterally. Cap refill < 2 seconds Musculoskeletal:  Inspection: Skin warm, dry, clear and intact No erythema, effusion noted Palpation: Anterior R shoulder mildly tender to palpation ROM: Limited ROM active and passive to R shoulder with upward movement of the R arm Skin: warm and dry Neurologic: Ambulates without difficulty; Sensation intact about the upper/ lower extremities Psychological: alert and cooperative; normal mood and affect  DIAGNOSTIC STUDIES:  DG Shoulder Right  Result Date: 08/19/2020 CLINICAL DATA:  Right shoulder pain. EXAM: RIGHT SHOULDER - 2+ VIEW COMPARISON:  Right shoulder x-rays dated December 08, 2015. FINDINGS: No acute fracture or dislocation. Similar appearing moderate acromioclavicular and mild glenohumeral degenerative changes. Soft tissues are unremarkable. IMPRESSION: 1.  No acute osseous abnormality. Electronically Signed   By: Titus Dubin M.D.   On: 08/19/2020 11:17     ASSESSMENT & PLAN:  1. Sprain of right shoulder, unspecified shoulder sprain type, initial encounter   2. Fall, initial encounter   3. Acute pain of right shoulder    Negative R shoulder xray today Continue conservative management of rest, ice, and gentle  stretches Take ibuprofen/tylenol as needed for pain relief (may cause abdominal discomfort, ulcers, and GI bleeds avoid taking with other NSAIDs) Follow up with sports medicine/orthopedics if symptoms persist Return or go to the ER if you have any new or worsening symptoms (fever, chills, chest pain, abdominal pain, changes in bowel or bladder habits, pain radiating into lower legs)   Reviewed expectations re: course of current medical issues. Questions answered. Outlined signs and symptoms indicating need for more acute intervention. Patient verbalized understanding. After Visit Summary given.       Faustino Congress, NP 08/19/20 1143

## 2020-08-19 NOTE — ED Triage Notes (Signed)
Tripped over a suitcase and fell on Sunday.  Now c/o RT shoulder pain.

## 2020-08-30 DIAGNOSIS — I10 Essential (primary) hypertension: Secondary | ICD-10-CM | POA: Diagnosis not present

## 2020-08-30 DIAGNOSIS — E1169 Type 2 diabetes mellitus with other specified complication: Secondary | ICD-10-CM | POA: Diagnosis not present

## 2020-08-30 DIAGNOSIS — E7849 Other hyperlipidemia: Secondary | ICD-10-CM | POA: Diagnosis not present

## 2020-09-08 ENCOUNTER — Other Ambulatory Visit (HOSPITAL_COMMUNITY): Payer: Self-pay

## 2020-09-08 MED FILL — Dulaglutide Soln Auto-injector 1.5 MG/0.5ML: SUBCUTANEOUS | 28 days supply | Qty: 2 | Fill #1 | Status: AC

## 2020-09-15 DIAGNOSIS — Z0001 Encounter for general adult medical examination with abnormal findings: Secondary | ICD-10-CM | POA: Diagnosis not present

## 2020-09-15 DIAGNOSIS — E083293 Diabetes mellitus due to underlying condition with mild nonproliferative diabetic retinopathy without macular edema, bilateral: Secondary | ICD-10-CM | POA: Diagnosis not present

## 2020-09-15 DIAGNOSIS — E1169 Type 2 diabetes mellitus with other specified complication: Secondary | ICD-10-CM | POA: Diagnosis not present

## 2020-09-15 DIAGNOSIS — I10 Essential (primary) hypertension: Secondary | ICD-10-CM | POA: Diagnosis not present

## 2020-09-15 DIAGNOSIS — E7849 Other hyperlipidemia: Secondary | ICD-10-CM | POA: Diagnosis not present

## 2020-09-15 DIAGNOSIS — M130 Polyarthritis, unspecified: Secondary | ICD-10-CM | POA: Diagnosis not present

## 2020-10-02 DIAGNOSIS — E1169 Type 2 diabetes mellitus with other specified complication: Secondary | ICD-10-CM | POA: Diagnosis not present

## 2020-10-02 DIAGNOSIS — I10 Essential (primary) hypertension: Secondary | ICD-10-CM | POA: Diagnosis not present

## 2020-10-02 DIAGNOSIS — E7800 Pure hypercholesterolemia, unspecified: Secondary | ICD-10-CM | POA: Diagnosis not present

## 2020-10-08 MED FILL — Dulaglutide Soln Auto-injector 1.5 MG/0.5ML: SUBCUTANEOUS | 28 days supply | Qty: 2 | Fill #2 | Status: AC

## 2020-10-09 ENCOUNTER — Other Ambulatory Visit (HOSPITAL_COMMUNITY): Payer: Self-pay

## 2020-10-10 ENCOUNTER — Other Ambulatory Visit (HOSPITAL_COMMUNITY): Payer: Self-pay

## 2020-10-10 ENCOUNTER — Other Ambulatory Visit (HOSPITAL_COMMUNITY): Payer: Self-pay | Admitting: Family Medicine

## 2020-10-10 MED FILL — Metformin HCl Tab 1000 MG: ORAL | 90 days supply | Qty: 180 | Fill #0 | Status: AC

## 2020-10-13 ENCOUNTER — Other Ambulatory Visit (HOSPITAL_COMMUNITY): Payer: Self-pay

## 2020-10-13 MED ORDER — HYDROCHLOROTHIAZIDE 25 MG PO TABS
25.0000 mg | ORAL_TABLET | Freq: Every day | ORAL | 1 refills | Status: DC
  Filled 2020-10-13: qty 90, 90d supply, fill #0
  Filled 2021-01-13: qty 90, 90d supply, fill #1

## 2020-10-13 MED ORDER — GLUCOSE BLOOD VI STRP
ORAL_STRIP | Freq: Two times a day (BID) | 0 refills | Status: AC
  Filled 2020-10-13: qty 300, 150d supply, fill #0
  Filled 2021-08-06: qty 200, 100d supply, fill #0

## 2020-10-13 MED ORDER — EZETIMIBE 10 MG PO TABS
10.0000 mg | ORAL_TABLET | Freq: Every day | ORAL | 1 refills | Status: DC
  Filled 2020-10-13: qty 90, 90d supply, fill #0
  Filled 2021-01-13: qty 90, 90d supply, fill #1

## 2020-10-14 ENCOUNTER — Other Ambulatory Visit (HOSPITAL_COMMUNITY): Payer: Self-pay

## 2020-10-15 ENCOUNTER — Other Ambulatory Visit (HOSPITAL_COMMUNITY): Payer: Self-pay

## 2020-10-17 ENCOUNTER — Other Ambulatory Visit (HOSPITAL_COMMUNITY): Payer: Self-pay

## 2020-10-20 ENCOUNTER — Other Ambulatory Visit (HOSPITAL_COMMUNITY): Payer: Self-pay | Admitting: Family Medicine

## 2020-10-20 DIAGNOSIS — Z1231 Encounter for screening mammogram for malignant neoplasm of breast: Secondary | ICD-10-CM

## 2020-10-24 ENCOUNTER — Other Ambulatory Visit: Payer: Self-pay

## 2020-10-24 ENCOUNTER — Ambulatory Visit (HOSPITAL_COMMUNITY)
Admission: RE | Admit: 2020-10-24 | Discharge: 2020-10-24 | Disposition: A | Discharge status: Home / Self Care | Payer: HMO | Source: Ambulatory Visit | Attending: Family Medicine | Admitting: Family Medicine

## 2020-10-24 DIAGNOSIS — L84 Corns and callosities: Secondary | ICD-10-CM | POA: Diagnosis not present

## 2020-10-24 DIAGNOSIS — B351 Tinea unguium: Secondary | ICD-10-CM | POA: Diagnosis not present

## 2020-10-24 DIAGNOSIS — Z1231 Encounter for screening mammogram for malignant neoplasm of breast: Secondary | ICD-10-CM | POA: Insufficient documentation

## 2020-10-24 DIAGNOSIS — E1142 Type 2 diabetes mellitus with diabetic polyneuropathy: Secondary | ICD-10-CM | POA: Diagnosis not present

## 2020-10-28 ENCOUNTER — Other Ambulatory Visit (HOSPITAL_COMMUNITY): Payer: Self-pay

## 2020-10-30 ENCOUNTER — Other Ambulatory Visit (HOSPITAL_COMMUNITY): Payer: Self-pay

## 2020-10-30 DIAGNOSIS — E1169 Type 2 diabetes mellitus with other specified complication: Secondary | ICD-10-CM | POA: Diagnosis not present

## 2020-10-30 DIAGNOSIS — E7849 Other hyperlipidemia: Secondary | ICD-10-CM | POA: Diagnosis not present

## 2020-10-30 DIAGNOSIS — I10 Essential (primary) hypertension: Secondary | ICD-10-CM | POA: Diagnosis not present

## 2020-10-30 DIAGNOSIS — M130 Polyarthritis, unspecified: Secondary | ICD-10-CM | POA: Diagnosis not present

## 2020-11-05 ENCOUNTER — Other Ambulatory Visit (HOSPITAL_COMMUNITY): Payer: Self-pay

## 2020-11-12 ENCOUNTER — Other Ambulatory Visit (HOSPITAL_COMMUNITY): Payer: Self-pay

## 2020-11-12 ENCOUNTER — Other Ambulatory Visit (HOSPITAL_COMMUNITY): Payer: Self-pay | Admitting: Family Medicine

## 2020-11-12 MED ORDER — FREESTYLE LITE W/DEVICE KIT
PACK | 0 refills | Status: AC
  Filled 2020-11-12: qty 1, 1d supply, fill #0

## 2020-11-12 MED ORDER — FREESTYLE LITE TEST VI STRP
ORAL_STRIP | 1 refills | Status: AC
  Filled 2020-11-12: qty 50, 50d supply, fill #0
  Filled 2021-02-10: qty 50, 50d supply, fill #1
  Filled 2021-05-04: qty 100, 100d supply, fill #2
  Filled 2021-08-05: qty 100, 100d supply, fill #3

## 2020-11-12 MED ORDER — FREESTYLE LANCETS MISC
5 refills | Status: AC
  Filled 2020-11-12: qty 100, 90d supply, fill #0
  Filled 2021-05-04: qty 100, 100d supply, fill #1
  Filled 2021-08-05: qty 100, 100d supply, fill #2

## 2020-11-13 ENCOUNTER — Other Ambulatory Visit (HOSPITAL_COMMUNITY): Payer: Self-pay

## 2020-11-13 MED ORDER — TELMISARTAN 40 MG PO TABS
ORAL_TABLET | ORAL | 1 refills | Status: DC
  Filled 2020-11-13: qty 90, 90d supply, fill #0
  Filled 2021-02-08: qty 90, 90d supply, fill #1

## 2020-11-30 DIAGNOSIS — E1169 Type 2 diabetes mellitus with other specified complication: Secondary | ICD-10-CM | POA: Diagnosis not present

## 2020-11-30 DIAGNOSIS — I10 Essential (primary) hypertension: Secondary | ICD-10-CM | POA: Diagnosis not present

## 2020-11-30 DIAGNOSIS — E7849 Other hyperlipidemia: Secondary | ICD-10-CM | POA: Diagnosis not present

## 2020-12-08 ENCOUNTER — Other Ambulatory Visit (HOSPITAL_COMMUNITY): Payer: Self-pay | Admitting: Family Medicine

## 2020-12-08 ENCOUNTER — Other Ambulatory Visit (HOSPITAL_COMMUNITY): Payer: Self-pay

## 2020-12-08 MED FILL — Insulin Glargine Soln Pen-Injector 100 Unit/ML: SUBCUTANEOUS | 31 days supply | Qty: 12 | Fill #1 | Status: AC

## 2020-12-09 ENCOUNTER — Other Ambulatory Visit (HOSPITAL_COMMUNITY): Payer: Self-pay

## 2020-12-10 ENCOUNTER — Other Ambulatory Visit (HOSPITAL_COMMUNITY): Payer: Self-pay

## 2020-12-18 ENCOUNTER — Other Ambulatory Visit (HOSPITAL_COMMUNITY): Payer: Self-pay

## 2020-12-22 MED FILL — Dulaglutide Soln Auto-injector 1.5 MG/0.5ML: SUBCUTANEOUS | 28 days supply | Qty: 2 | Fill #3 | Status: AC

## 2020-12-23 ENCOUNTER — Other Ambulatory Visit (HOSPITAL_COMMUNITY): Payer: Self-pay

## 2020-12-26 DIAGNOSIS — I10 Essential (primary) hypertension: Secondary | ICD-10-CM | POA: Diagnosis not present

## 2020-12-26 DIAGNOSIS — E1169 Type 2 diabetes mellitus with other specified complication: Secondary | ICD-10-CM | POA: Diagnosis not present

## 2021-01-07 DIAGNOSIS — L84 Corns and callosities: Secondary | ICD-10-CM | POA: Diagnosis not present

## 2021-01-07 DIAGNOSIS — B351 Tinea unguium: Secondary | ICD-10-CM | POA: Diagnosis not present

## 2021-01-07 DIAGNOSIS — E1142 Type 2 diabetes mellitus with diabetic polyneuropathy: Secondary | ICD-10-CM | POA: Diagnosis not present

## 2021-01-13 ENCOUNTER — Other Ambulatory Visit (HOSPITAL_COMMUNITY): Payer: Self-pay | Admitting: Family Medicine

## 2021-01-13 ENCOUNTER — Other Ambulatory Visit (HOSPITAL_COMMUNITY): Payer: Self-pay

## 2021-01-13 MED ORDER — METFORMIN HCL 1000 MG PO TABS
2000.0000 mg | ORAL_TABLET | Freq: Every evening | ORAL | 3 refills | Status: AC
  Filled 2021-01-13: qty 180, 90d supply, fill #0
  Filled 2021-04-15: qty 180, 90d supply, fill #1
  Filled 2021-07-20: qty 180, 90d supply, fill #2
  Filled 2021-10-08: qty 180, 90d supply, fill #3

## 2021-01-30 DIAGNOSIS — I10 Essential (primary) hypertension: Secondary | ICD-10-CM | POA: Diagnosis not present

## 2021-01-30 DIAGNOSIS — M130 Polyarthritis, unspecified: Secondary | ICD-10-CM | POA: Diagnosis not present

## 2021-01-30 DIAGNOSIS — E1169 Type 2 diabetes mellitus with other specified complication: Secondary | ICD-10-CM | POA: Diagnosis not present

## 2021-01-30 DIAGNOSIS — E7849 Other hyperlipidemia: Secondary | ICD-10-CM | POA: Diagnosis not present

## 2021-02-08 MED FILL — Dulaglutide Soln Auto-injector 1.5 MG/0.5ML: SUBCUTANEOUS | 28 days supply | Qty: 2 | Fill #4 | Status: AC

## 2021-02-09 ENCOUNTER — Other Ambulatory Visit (HOSPITAL_COMMUNITY): Payer: Self-pay

## 2021-02-10 ENCOUNTER — Other Ambulatory Visit (HOSPITAL_COMMUNITY): Payer: Self-pay

## 2021-03-02 DIAGNOSIS — I10 Essential (primary) hypertension: Secondary | ICD-10-CM | POA: Diagnosis not present

## 2021-03-02 DIAGNOSIS — M130 Polyarthritis, unspecified: Secondary | ICD-10-CM | POA: Diagnosis not present

## 2021-03-02 DIAGNOSIS — E1169 Type 2 diabetes mellitus with other specified complication: Secondary | ICD-10-CM | POA: Diagnosis not present

## 2021-03-06 DIAGNOSIS — S43401D Unspecified sprain of right shoulder joint, subsequent encounter: Secondary | ICD-10-CM | POA: Diagnosis not present

## 2021-03-06 DIAGNOSIS — E119 Type 2 diabetes mellitus without complications: Secondary | ICD-10-CM | POA: Diagnosis not present

## 2021-03-06 DIAGNOSIS — Z794 Long term (current) use of insulin: Secondary | ICD-10-CM | POA: Diagnosis not present

## 2021-03-06 DIAGNOSIS — E1142 Type 2 diabetes mellitus with diabetic polyneuropathy: Secondary | ICD-10-CM | POA: Diagnosis not present

## 2021-03-06 DIAGNOSIS — Z9181 History of falling: Secondary | ICD-10-CM | POA: Diagnosis not present

## 2021-03-06 DIAGNOSIS — Z7984 Long term (current) use of oral hypoglycemic drugs: Secondary | ICD-10-CM | POA: Diagnosis not present

## 2021-03-20 DIAGNOSIS — E1142 Type 2 diabetes mellitus with diabetic polyneuropathy: Secondary | ICD-10-CM | POA: Diagnosis not present

## 2021-03-20 DIAGNOSIS — L84 Corns and callosities: Secondary | ICD-10-CM | POA: Diagnosis not present

## 2021-03-20 DIAGNOSIS — B351 Tinea unguium: Secondary | ICD-10-CM | POA: Diagnosis not present

## 2021-03-27 ENCOUNTER — Other Ambulatory Visit (HOSPITAL_COMMUNITY): Payer: Self-pay | Admitting: Family Medicine

## 2021-03-30 ENCOUNTER — Other Ambulatory Visit (HOSPITAL_COMMUNITY): Payer: Self-pay

## 2021-03-30 MED ORDER — EZETIMIBE 10 MG PO TABS
10.0000 mg | ORAL_TABLET | Freq: Every day | ORAL | 1 refills | Status: DC
  Filled 2021-03-30: qty 90, 90d supply, fill #0
  Filled 2021-06-26: qty 90, 90d supply, fill #1

## 2021-03-30 MED ORDER — HYDROCHLOROTHIAZIDE 25 MG PO TABS
25.0000 mg | ORAL_TABLET | Freq: Every day | ORAL | 1 refills | Status: DC
  Filled 2021-03-30: qty 90, 90d supply, fill #0
  Filled 2021-06-26: qty 90, 90d supply, fill #1

## 2021-04-01 ENCOUNTER — Other Ambulatory Visit (HOSPITAL_COMMUNITY): Payer: Self-pay

## 2021-04-06 ENCOUNTER — Other Ambulatory Visit (HOSPITAL_COMMUNITY): Payer: Self-pay

## 2021-04-15 ENCOUNTER — Other Ambulatory Visit (HOSPITAL_COMMUNITY): Payer: Self-pay

## 2021-04-15 MED ORDER — TELMISARTAN 40 MG PO TABS
40.0000 mg | ORAL_TABLET | Freq: Every day | ORAL | 1 refills | Status: DC
Start: 2021-04-15 — End: 2021-11-09
  Filled 2021-04-15: qty 90, 90d supply, fill #0
  Filled 2021-08-05: qty 90, 90d supply, fill #1

## 2021-04-17 ENCOUNTER — Other Ambulatory Visit (HOSPITAL_COMMUNITY): Payer: Self-pay

## 2021-05-01 DIAGNOSIS — M130 Polyarthritis, unspecified: Secondary | ICD-10-CM | POA: Diagnosis not present

## 2021-05-01 DIAGNOSIS — E7849 Other hyperlipidemia: Secondary | ICD-10-CM | POA: Diagnosis not present

## 2021-05-01 DIAGNOSIS — E1169 Type 2 diabetes mellitus with other specified complication: Secondary | ICD-10-CM | POA: Diagnosis not present

## 2021-05-01 DIAGNOSIS — I10 Essential (primary) hypertension: Secondary | ICD-10-CM | POA: Diagnosis not present

## 2021-05-04 ENCOUNTER — Other Ambulatory Visit (HOSPITAL_COMMUNITY): Payer: Self-pay

## 2021-05-05 ENCOUNTER — Other Ambulatory Visit (HOSPITAL_COMMUNITY): Payer: Self-pay

## 2021-05-06 ENCOUNTER — Other Ambulatory Visit (HOSPITAL_COMMUNITY): Payer: Self-pay

## 2021-05-06 MED ORDER — TRULICITY 1.5 MG/0.5ML ~~LOC~~ SOAJ
1.5000 mg | SUBCUTANEOUS | 2 refills | Status: AC
  Filled 2021-05-06: qty 2, 28d supply, fill #0
  Filled 2021-06-02: qty 2, 28d supply, fill #1
  Filled 2021-06-26: qty 2, 28d supply, fill #2
  Filled 2021-07-20: qty 2, 28d supply, fill #3
  Filled 2021-08-19: qty 2, 28d supply, fill #4

## 2021-05-07 ENCOUNTER — Other Ambulatory Visit (HOSPITAL_COMMUNITY): Payer: Self-pay

## 2021-05-12 DIAGNOSIS — Z6834 Body mass index (BMI) 34.0-34.9, adult: Secondary | ICD-10-CM | POA: Diagnosis not present

## 2021-05-12 DIAGNOSIS — L280 Lichen simplex chronicus: Secondary | ICD-10-CM | POA: Diagnosis not present

## 2021-05-12 DIAGNOSIS — E669 Obesity, unspecified: Secondary | ICD-10-CM | POA: Diagnosis not present

## 2021-05-12 DIAGNOSIS — E1169 Type 2 diabetes mellitus with other specified complication: Secondary | ICD-10-CM | POA: Diagnosis not present

## 2021-05-12 DIAGNOSIS — I10 Essential (primary) hypertension: Secondary | ICD-10-CM | POA: Diagnosis not present

## 2021-05-21 DIAGNOSIS — B351 Tinea unguium: Secondary | ICD-10-CM | POA: Diagnosis not present

## 2021-05-21 DIAGNOSIS — E1142 Type 2 diabetes mellitus with diabetic polyneuropathy: Secondary | ICD-10-CM | POA: Diagnosis not present

## 2021-05-21 DIAGNOSIS — L84 Corns and callosities: Secondary | ICD-10-CM | POA: Diagnosis not present

## 2021-06-02 ENCOUNTER — Other Ambulatory Visit (HOSPITAL_COMMUNITY): Payer: Self-pay

## 2021-06-08 DIAGNOSIS — E1142 Type 2 diabetes mellitus with diabetic polyneuropathy: Secondary | ICD-10-CM | POA: Diagnosis not present

## 2021-06-08 DIAGNOSIS — E669 Obesity, unspecified: Secondary | ICD-10-CM | POA: Diagnosis not present

## 2021-06-08 DIAGNOSIS — D8481 Immunodeficiency due to conditions classified elsewhere: Secondary | ICD-10-CM | POA: Diagnosis not present

## 2021-06-08 DIAGNOSIS — R32 Unspecified urinary incontinence: Secondary | ICD-10-CM | POA: Diagnosis not present

## 2021-06-08 DIAGNOSIS — I10 Essential (primary) hypertension: Secondary | ICD-10-CM | POA: Diagnosis not present

## 2021-06-08 DIAGNOSIS — Z9849 Cataract extraction status, unspecified eye: Secondary | ICD-10-CM | POA: Diagnosis not present

## 2021-06-08 DIAGNOSIS — E1169 Type 2 diabetes mellitus with other specified complication: Secondary | ICD-10-CM | POA: Diagnosis not present

## 2021-06-08 DIAGNOSIS — R001 Bradycardia, unspecified: Secondary | ICD-10-CM | POA: Diagnosis not present

## 2021-06-08 DIAGNOSIS — E785 Hyperlipidemia, unspecified: Secondary | ICD-10-CM | POA: Diagnosis not present

## 2021-06-08 DIAGNOSIS — E1165 Type 2 diabetes mellitus with hyperglycemia: Secondary | ICD-10-CM | POA: Diagnosis not present

## 2021-06-29 ENCOUNTER — Other Ambulatory Visit (HOSPITAL_COMMUNITY): Payer: Self-pay

## 2021-07-20 ENCOUNTER — Other Ambulatory Visit (HOSPITAL_COMMUNITY): Payer: Self-pay

## 2021-07-30 DIAGNOSIS — L84 Corns and callosities: Secondary | ICD-10-CM | POA: Diagnosis not present

## 2021-07-30 DIAGNOSIS — B351 Tinea unguium: Secondary | ICD-10-CM | POA: Diagnosis not present

## 2021-07-30 DIAGNOSIS — E1142 Type 2 diabetes mellitus with diabetic polyneuropathy: Secondary | ICD-10-CM | POA: Diagnosis not present

## 2021-08-05 ENCOUNTER — Other Ambulatory Visit (HOSPITAL_COMMUNITY): Payer: Self-pay

## 2021-08-06 ENCOUNTER — Other Ambulatory Visit (HOSPITAL_COMMUNITY): Payer: Self-pay

## 2021-08-14 ENCOUNTER — Other Ambulatory Visit (HOSPITAL_COMMUNITY): Payer: Self-pay

## 2021-08-18 ENCOUNTER — Other Ambulatory Visit (HOSPITAL_COMMUNITY): Payer: Self-pay

## 2021-08-18 MED ORDER — ACCU-CHEK SOFTCLIX LANCETS MISC
1 refills | Status: AC
Start: 2021-08-18 — End: ?
  Filled 2021-08-18: qty 200, 100d supply, fill #0

## 2021-08-18 MED ORDER — BLOOD GLUCOSE MONITOR SYSTEM W/DEVICE KIT
PACK | 0 refills | Status: AC
  Filled 2021-08-18: qty 1, 30d supply, fill #0

## 2021-08-18 MED ORDER — GLUCOSE BLOOD VI STRP
ORAL_STRIP | 1 refills | Status: AC
  Filled 2021-08-18: qty 150, 75d supply, fill #0

## 2021-08-20 ENCOUNTER — Other Ambulatory Visit (HOSPITAL_COMMUNITY): Payer: Self-pay

## 2021-08-26 DIAGNOSIS — Z20822 Contact with and (suspected) exposure to covid-19: Secondary | ICD-10-CM | POA: Diagnosis not present

## 2021-09-14 ENCOUNTER — Other Ambulatory Visit (HOSPITAL_COMMUNITY): Payer: Self-pay

## 2021-09-14 DIAGNOSIS — E7800 Pure hypercholesterolemia, unspecified: Secondary | ICD-10-CM | POA: Diagnosis not present

## 2021-09-14 DIAGNOSIS — I10 Essential (primary) hypertension: Secondary | ICD-10-CM | POA: Diagnosis not present

## 2021-09-14 DIAGNOSIS — E1169 Type 2 diabetes mellitus with other specified complication: Secondary | ICD-10-CM | POA: Diagnosis not present

## 2021-09-14 DIAGNOSIS — E782 Mixed hyperlipidemia: Secondary | ICD-10-CM | POA: Diagnosis not present

## 2021-09-14 DIAGNOSIS — E669 Obesity, unspecified: Secondary | ICD-10-CM | POA: Diagnosis not present

## 2021-09-14 DIAGNOSIS — M130 Polyarthritis, unspecified: Secondary | ICD-10-CM | POA: Diagnosis not present

## 2021-09-14 DIAGNOSIS — Z0000 Encounter for general adult medical examination without abnormal findings: Secondary | ICD-10-CM | POA: Diagnosis not present

## 2021-09-14 MED ORDER — ONETOUCH VERIO FLEX SYSTEM W/DEVICE KIT
PACK | 1 refills | Status: AC
  Filled 2021-09-14: qty 1, 1d supply, fill #0

## 2021-09-14 MED ORDER — GLUCOSE BLOOD VI STRP
ORAL_STRIP | 6 refills | Status: AC
Start: 2021-09-14 — End: ?
  Filled 2021-09-14: qty 200, 50d supply, fill #0

## 2021-09-14 MED ORDER — ONETOUCH DELICA PLUS LANCET33G MISC
6 refills | Status: AC
  Filled 2021-09-14: qty 200, 50d supply, fill #0

## 2021-09-22 ENCOUNTER — Other Ambulatory Visit (HOSPITAL_COMMUNITY): Payer: Self-pay | Admitting: Family Medicine

## 2021-09-22 DIAGNOSIS — Z1231 Encounter for screening mammogram for malignant neoplasm of breast: Secondary | ICD-10-CM

## 2021-10-05 DIAGNOSIS — Z1211 Encounter for screening for malignant neoplasm of colon: Secondary | ICD-10-CM | POA: Diagnosis not present

## 2021-10-05 DIAGNOSIS — Z1212 Encounter for screening for malignant neoplasm of rectum: Secondary | ICD-10-CM | POA: Diagnosis not present

## 2021-10-08 ENCOUNTER — Other Ambulatory Visit (HOSPITAL_COMMUNITY): Payer: Self-pay

## 2021-10-08 DIAGNOSIS — E1142 Type 2 diabetes mellitus with diabetic polyneuropathy: Secondary | ICD-10-CM | POA: Diagnosis not present

## 2021-10-08 DIAGNOSIS — B351 Tinea unguium: Secondary | ICD-10-CM | POA: Diagnosis not present

## 2021-10-08 DIAGNOSIS — L84 Corns and callosities: Secondary | ICD-10-CM | POA: Diagnosis not present

## 2021-10-08 MED ORDER — HYDROCHLOROTHIAZIDE 25 MG PO TABS
25.0000 mg | ORAL_TABLET | Freq: Every day | ORAL | 1 refills | Status: AC
  Filled 2021-10-08: qty 90, 90d supply, fill #0
  Filled 2021-12-31: qty 90, 90d supply, fill #1

## 2021-10-08 MED ORDER — EZETIMIBE 10 MG PO TABS
10.0000 mg | ORAL_TABLET | Freq: Every day | ORAL | 1 refills | Status: AC
  Filled 2021-10-08: qty 90, 90d supply, fill #0
  Filled 2021-12-31: qty 90, 90d supply, fill #1

## 2021-10-09 ENCOUNTER — Other Ambulatory Visit (HOSPITAL_COMMUNITY): Payer: Self-pay

## 2021-10-24 IMAGING — MG DIGITAL SCREENING BILAT W/ TOMO W/ CAD
8 series · 8 of 24 positions shown · non-contrast
Comparison: Previous exam(s).

CLINICAL DATA: Screening.

EXAM:
DIGITAL SCREENING BILATERAL MAMMOGRAM WITH TOMO AND CAD

[R MLO synth-2D]
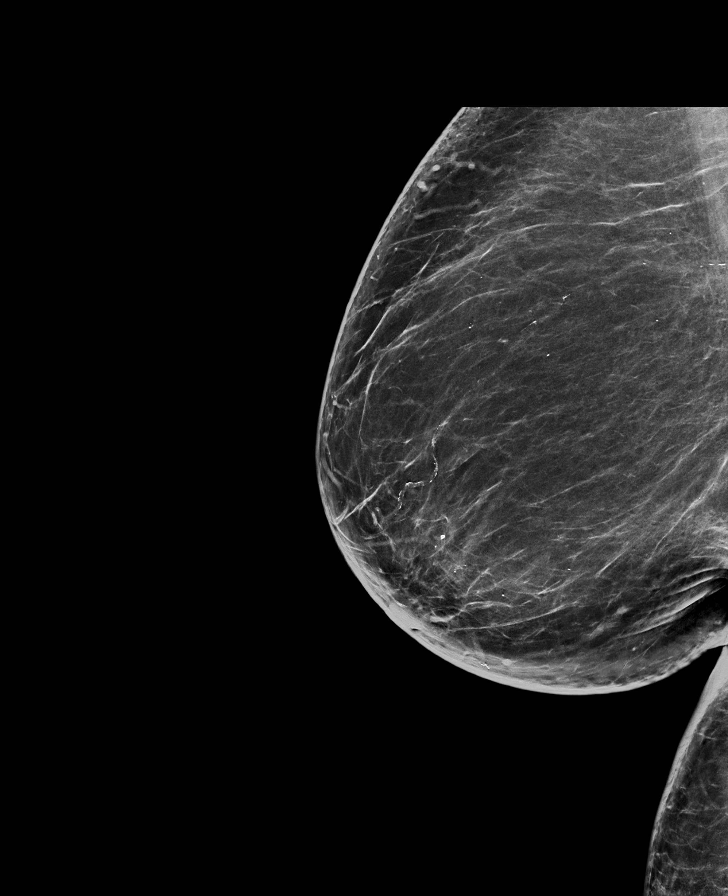

[L CC synth-2D]
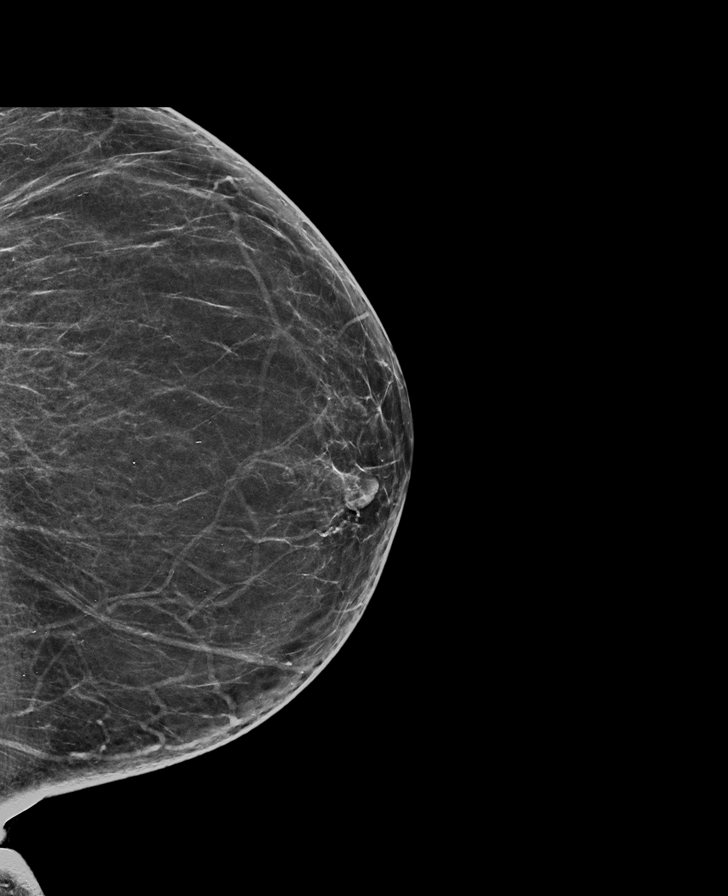

[R CC synth-2D]
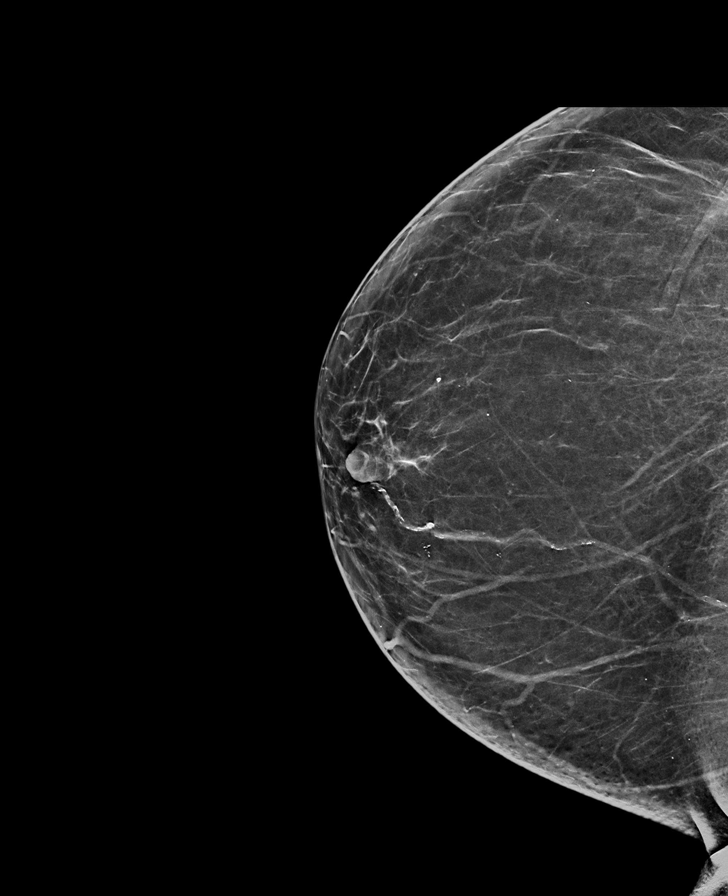

[L MLO synth-2D]
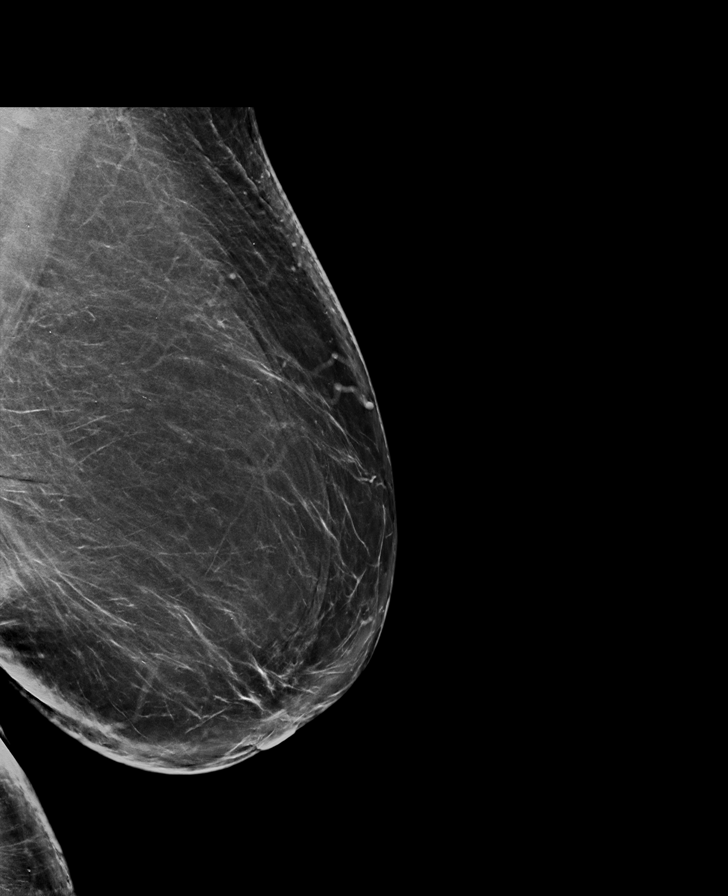

[L MLO tomo · tomo slice 46/91.0]
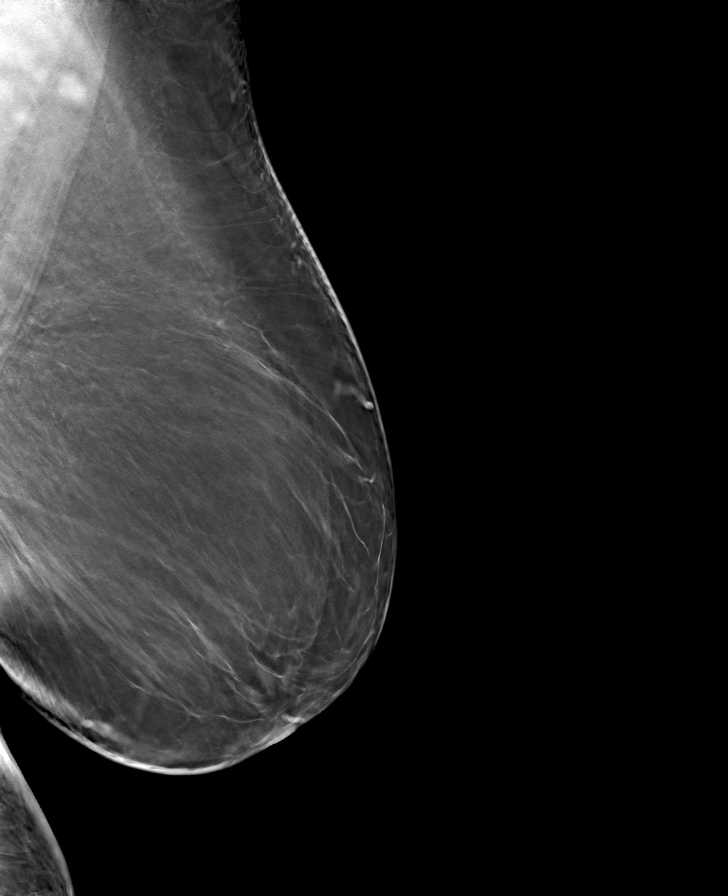

[R CC tomo · tomo slice 37/72.0]
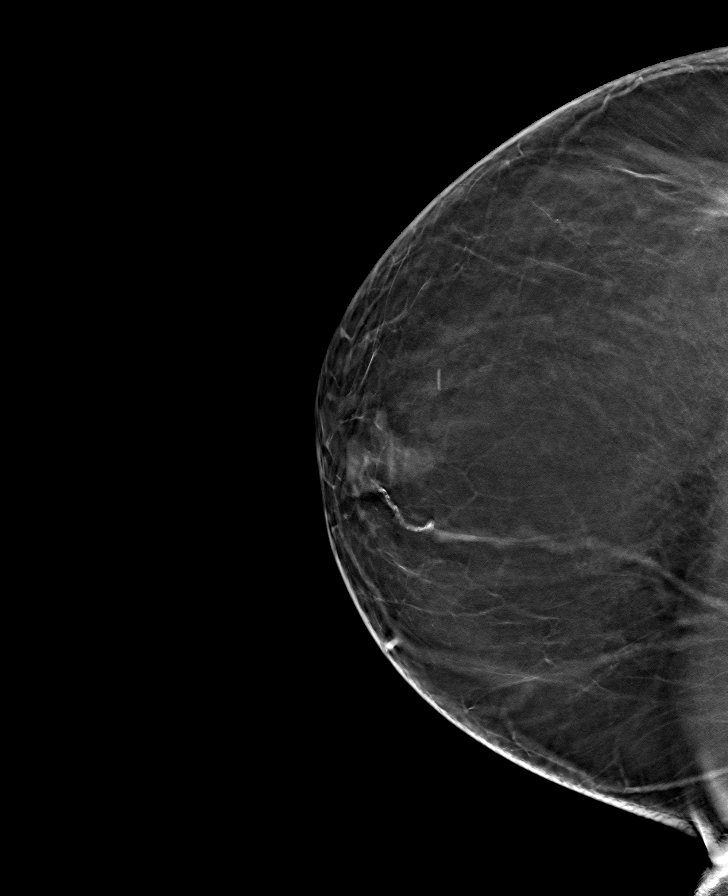

[R MLO tomo · tomo slice 43/84.0]
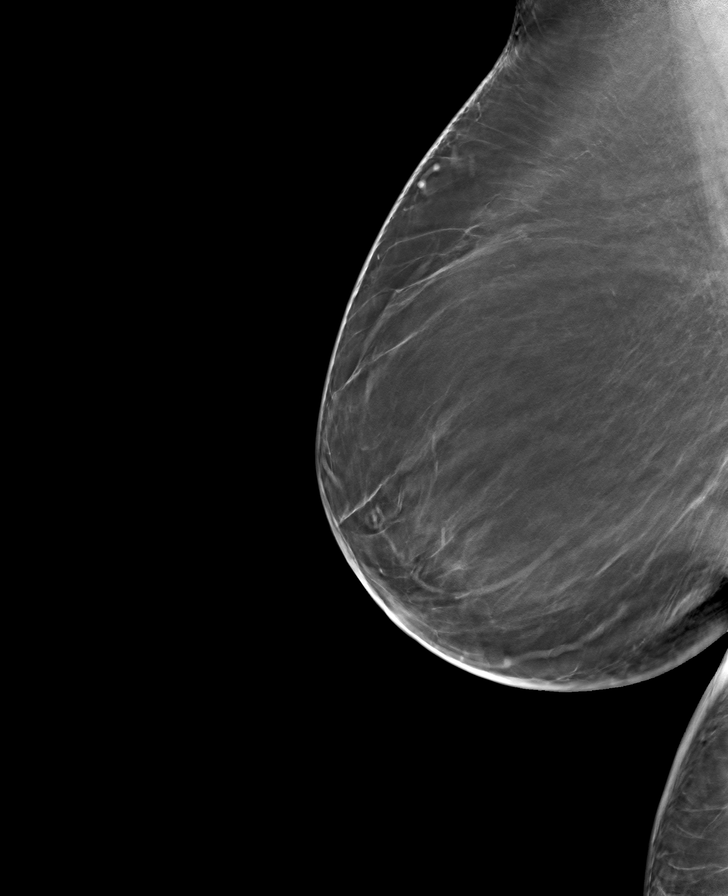

[L CC tomo · tomo slice 35/68.0]
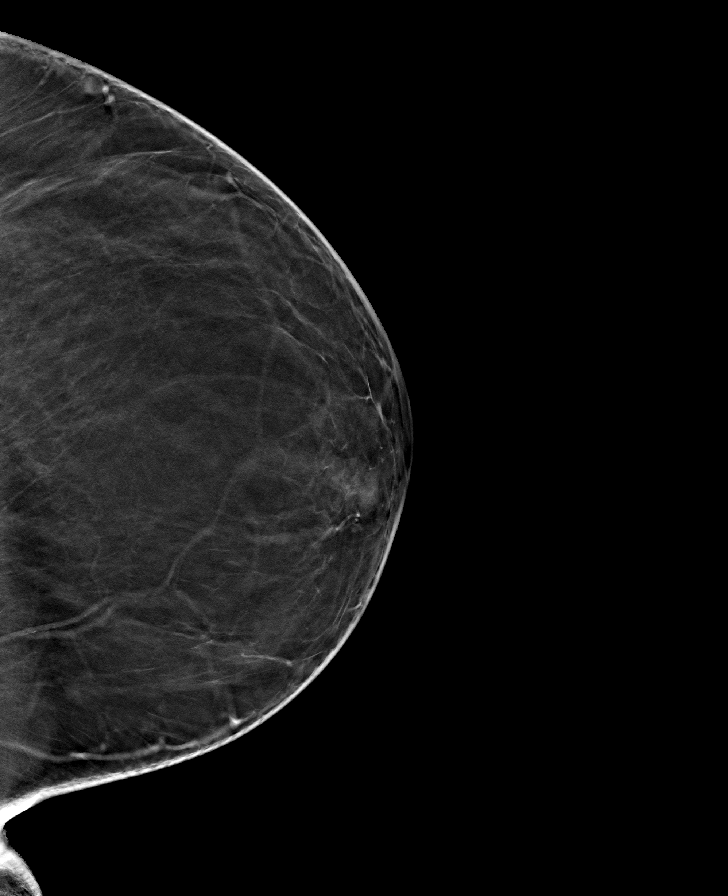

[8 of 24 positions shown; findings below may reference images not displayed]

ACR Breast Density Category b: There are scattered areas of
fibroglandular density.
FINDINGS: There are no findings suspicious for malignancy. Images were
processed with CAD.
IMPRESSION: No mammographic evidence of malignancy. A result letter of this
screening mammogram will be mailed directly to the patient.

RECOMMENDATION:
Screening mammogram in one year. (Code:CN-U-775)

BI-RADS CATEGORY  1: Negative.

## 2021-10-26 ENCOUNTER — Ambulatory Visit (HOSPITAL_COMMUNITY)
Admission: RE | Admit: 2021-10-26 | Discharge: 2021-10-26 | Disposition: A | Discharge status: Home / Self Care | Payer: HMO | Source: Ambulatory Visit | Attending: Family Medicine | Admitting: Family Medicine

## 2021-10-26 DIAGNOSIS — Z1231 Encounter for screening mammogram for malignant neoplasm of breast: Secondary | ICD-10-CM | POA: Insufficient documentation

## 2021-11-09 ENCOUNTER — Other Ambulatory Visit (HOSPITAL_COMMUNITY): Payer: Self-pay

## 2021-11-09 MED ORDER — TELMISARTAN 40 MG PO TABS
40.0000 mg | ORAL_TABLET | Freq: Every day | ORAL | 1 refills | Status: DC
  Filled 2021-11-09: qty 90, 90d supply, fill #0
  Filled 2022-01-24: qty 90, 90d supply, fill #1

## 2021-11-11 ENCOUNTER — Other Ambulatory Visit (HOSPITAL_COMMUNITY): Payer: Self-pay

## 2021-11-30 ENCOUNTER — Other Ambulatory Visit (HOSPITAL_COMMUNITY): Payer: Self-pay

## 2021-12-01 ENCOUNTER — Other Ambulatory Visit (HOSPITAL_COMMUNITY): Payer: Self-pay

## 2021-12-01 MED ORDER — INSULIN PEN NEEDLE 31G X 8 MM MISC
100.0000 | 2 refills | Status: AC
  Filled 2021-12-01: qty 100, 50d supply, fill #0
  Filled 2022-03-11: qty 100, 50d supply, fill #1

## 2021-12-17 DIAGNOSIS — E1142 Type 2 diabetes mellitus with diabetic polyneuropathy: Secondary | ICD-10-CM | POA: Diagnosis not present

## 2021-12-17 DIAGNOSIS — L84 Corns and callosities: Secondary | ICD-10-CM | POA: Diagnosis not present

## 2021-12-17 DIAGNOSIS — B351 Tinea unguium: Secondary | ICD-10-CM | POA: Diagnosis not present

## 2022-01-01 ENCOUNTER — Other Ambulatory Visit (HOSPITAL_COMMUNITY): Payer: Self-pay

## 2022-01-24 ENCOUNTER — Other Ambulatory Visit (HOSPITAL_COMMUNITY): Payer: Self-pay

## 2022-01-25 ENCOUNTER — Other Ambulatory Visit (HOSPITAL_COMMUNITY): Payer: Self-pay

## 2022-01-26 ENCOUNTER — Other Ambulatory Visit (HOSPITAL_COMMUNITY): Payer: Self-pay

## 2022-01-26 DIAGNOSIS — E1169 Type 2 diabetes mellitus with other specified complication: Secondary | ICD-10-CM | POA: Diagnosis not present

## 2022-01-26 DIAGNOSIS — I10 Essential (primary) hypertension: Secondary | ICD-10-CM | POA: Diagnosis not present

## 2022-01-26 DIAGNOSIS — E7849 Other hyperlipidemia: Secondary | ICD-10-CM | POA: Diagnosis not present

## 2022-01-26 DIAGNOSIS — Z6834 Body mass index (BMI) 34.0-34.9, adult: Secondary | ICD-10-CM | POA: Diagnosis not present

## 2022-01-26 DIAGNOSIS — E669 Obesity, unspecified: Secondary | ICD-10-CM | POA: Diagnosis not present

## 2022-01-28 ENCOUNTER — Other Ambulatory Visit (HOSPITAL_COMMUNITY): Payer: Self-pay

## 2022-01-28 MED ORDER — METFORMIN HCL 1000 MG PO TABS
1000.0000 mg | ORAL_TABLET | Freq: Two times a day (BID) | ORAL | 1 refills | Status: AC
  Filled 2022-01-28: qty 180, 90d supply, fill #0
  Filled 2022-04-19: qty 180, 90d supply, fill #1

## 2022-02-08 ENCOUNTER — Other Ambulatory Visit (HOSPITAL_COMMUNITY): Payer: Self-pay | Admitting: Family Medicine

## 2022-02-08 DIAGNOSIS — Z13820 Encounter for screening for osteoporosis: Secondary | ICD-10-CM

## 2022-02-15 ENCOUNTER — Ambulatory Visit (HOSPITAL_COMMUNITY)
Admission: RE | Admit: 2022-02-15 | Discharge: 2022-02-15 | Disposition: A | Discharge status: Home / Self Care | Payer: HMO | Source: Ambulatory Visit | Attending: Family Medicine | Admitting: Family Medicine

## 2022-02-15 DIAGNOSIS — Z780 Asymptomatic menopausal state: Secondary | ICD-10-CM | POA: Insufficient documentation

## 2022-02-15 DIAGNOSIS — Z13820 Encounter for screening for osteoporosis: Secondary | ICD-10-CM | POA: Insufficient documentation

## 2022-02-15 DIAGNOSIS — Z794 Long term (current) use of insulin: Secondary | ICD-10-CM | POA: Insufficient documentation

## 2022-02-15 DIAGNOSIS — E119 Type 2 diabetes mellitus without complications: Secondary | ICD-10-CM | POA: Diagnosis not present

## 2022-03-10 ENCOUNTER — Other Ambulatory Visit (HOSPITAL_COMMUNITY): Payer: Self-pay

## 2022-03-10 MED ORDER — ERGOCALCIFEROL 1.25 MG (50000 UT) PO CAPS
ORAL_CAPSULE | ORAL | 1 refills | Status: AC
  Filled 2022-03-10: qty 12, 84d supply, fill #0
  Filled 2022-05-27: qty 12, 84d supply, fill #1

## 2022-03-12 ENCOUNTER — Other Ambulatory Visit (HOSPITAL_COMMUNITY): Payer: Self-pay

## 2022-04-19 ENCOUNTER — Other Ambulatory Visit (HOSPITAL_COMMUNITY): Payer: Self-pay

## 2022-04-19 MED ORDER — TELMISARTAN 40 MG PO TABS
40.0000 mg | ORAL_TABLET | Freq: Every day | ORAL | 1 refills | Status: AC
Start: 2022-04-19 — End: ?
  Filled 2022-04-19: qty 90, 90d supply, fill #0

## 2022-04-20 ENCOUNTER — Other Ambulatory Visit (HOSPITAL_COMMUNITY): Payer: Self-pay

## 2022-05-20 DIAGNOSIS — B351 Tinea unguium: Secondary | ICD-10-CM | POA: Diagnosis not present

## 2022-05-20 DIAGNOSIS — E1142 Type 2 diabetes mellitus with diabetic polyneuropathy: Secondary | ICD-10-CM | POA: Diagnosis not present

## 2022-05-20 DIAGNOSIS — L84 Corns and callosities: Secondary | ICD-10-CM | POA: Diagnosis not present

## 2022-06-01 DIAGNOSIS — I10 Essential (primary) hypertension: Secondary | ICD-10-CM | POA: Diagnosis not present

## 2022-06-01 DIAGNOSIS — M130 Polyarthritis, unspecified: Secondary | ICD-10-CM | POA: Diagnosis not present

## 2022-06-01 DIAGNOSIS — E1169 Type 2 diabetes mellitus with other specified complication: Secondary | ICD-10-CM | POA: Diagnosis not present

## 2022-06-15 DIAGNOSIS — E782 Mixed hyperlipidemia: Secondary | ICD-10-CM | POA: Diagnosis not present

## 2022-06-15 DIAGNOSIS — E559 Vitamin D deficiency, unspecified: Secondary | ICD-10-CM | POA: Diagnosis not present

## 2022-06-15 DIAGNOSIS — I10 Essential (primary) hypertension: Secondary | ICD-10-CM | POA: Diagnosis not present

## 2022-06-15 DIAGNOSIS — E1169 Type 2 diabetes mellitus with other specified complication: Secondary | ICD-10-CM | POA: Diagnosis not present

## 2022-06-17 ENCOUNTER — Other Ambulatory Visit (HOSPITAL_COMMUNITY): Payer: Self-pay

## 2022-06-17 DIAGNOSIS — E782 Mixed hyperlipidemia: Secondary | ICD-10-CM | POA: Diagnosis not present

## 2022-06-17 DIAGNOSIS — I10 Essential (primary) hypertension: Secondary | ICD-10-CM | POA: Diagnosis not present

## 2022-06-17 DIAGNOSIS — E6609 Other obesity due to excess calories: Secondary | ICD-10-CM | POA: Diagnosis not present

## 2022-06-17 DIAGNOSIS — E1169 Type 2 diabetes mellitus with other specified complication: Secondary | ICD-10-CM | POA: Diagnosis not present

## 2022-06-17 MED ORDER — EMPAGLIFLOZIN 25 MG PO TABS
25.0000 mg | ORAL_TABLET | Freq: Every day | ORAL | 1 refills | Status: AC
  Filled 2022-06-17: qty 90, 90d supply, fill #0
  Filled 2022-09-05: qty 90, 90d supply, fill #1

## 2022-06-18 ENCOUNTER — Other Ambulatory Visit (HOSPITAL_COMMUNITY): Payer: Self-pay

## 2022-07-10 LAB — AMB RESULTS CONSOLE CBG: Glucose: 85

## 2022-07-16 ENCOUNTER — Encounter: Payer: Self-pay | Admitting: *Deleted

## 2022-07-16 NOTE — Progress Notes (Unsigned)
Pt seen at 07/10/22 screening event and b/p wnl. Event RN noted pt stated her name had been misspelled - [facility] IT contacted and chart merged with existing chart with correct spelling- pt so notified. Pt also confirmed her PCP remains Dr. Criss Rosales. No additional health equity team support indicated at this time.

## 2022-07-29 DIAGNOSIS — L84 Corns and callosities: Secondary | ICD-10-CM | POA: Diagnosis not present

## 2022-07-29 DIAGNOSIS — B351 Tinea unguium: Secondary | ICD-10-CM | POA: Diagnosis not present

## 2022-07-29 DIAGNOSIS — E1142 Type 2 diabetes mellitus with diabetic polyneuropathy: Secondary | ICD-10-CM | POA: Diagnosis not present

## 2022-08-01 DIAGNOSIS — I10 Essential (primary) hypertension: Secondary | ICD-10-CM | POA: Diagnosis not present

## 2022-08-01 DIAGNOSIS — M130 Polyarthritis, unspecified: Secondary | ICD-10-CM | POA: Diagnosis not present

## 2022-08-01 DIAGNOSIS — E1169 Type 2 diabetes mellitus with other specified complication: Secondary | ICD-10-CM | POA: Diagnosis not present

## 2022-08-31 DIAGNOSIS — I10 Essential (primary) hypertension: Secondary | ICD-10-CM | POA: Diagnosis not present

## 2022-08-31 DIAGNOSIS — M130 Polyarthritis, unspecified: Secondary | ICD-10-CM | POA: Diagnosis not present

## 2022-08-31 DIAGNOSIS — E1169 Type 2 diabetes mellitus with other specified complication: Secondary | ICD-10-CM | POA: Diagnosis not present

## 2022-09-15 DIAGNOSIS — M130 Polyarthritis, unspecified: Secondary | ICD-10-CM | POA: Diagnosis not present

## 2022-09-15 DIAGNOSIS — E1169 Type 2 diabetes mellitus with other specified complication: Secondary | ICD-10-CM | POA: Diagnosis not present

## 2022-09-15 DIAGNOSIS — I10 Essential (primary) hypertension: Secondary | ICD-10-CM | POA: Diagnosis not present

## 2022-09-15 DIAGNOSIS — E782 Mixed hyperlipidemia: Secondary | ICD-10-CM | POA: Diagnosis not present

## 2022-09-16 DIAGNOSIS — E1169 Type 2 diabetes mellitus with other specified complication: Secondary | ICD-10-CM | POA: Diagnosis not present

## 2022-09-16 DIAGNOSIS — I10 Essential (primary) hypertension: Secondary | ICD-10-CM | POA: Diagnosis not present

## 2022-09-16 DIAGNOSIS — E785 Hyperlipidemia, unspecified: Secondary | ICD-10-CM | POA: Diagnosis not present

## 2022-09-20 DIAGNOSIS — E113293 Type 2 diabetes mellitus with mild nonproliferative diabetic retinopathy without macular edema, bilateral: Secondary | ICD-10-CM | POA: Diagnosis not present

## 2022-10-07 DIAGNOSIS — E1142 Type 2 diabetes mellitus with diabetic polyneuropathy: Secondary | ICD-10-CM | POA: Diagnosis not present

## 2022-10-07 DIAGNOSIS — B351 Tinea unguium: Secondary | ICD-10-CM | POA: Diagnosis not present

## 2022-10-07 DIAGNOSIS — L84 Corns and callosities: Secondary | ICD-10-CM | POA: Diagnosis not present

## 2022-10-18 ENCOUNTER — Other Ambulatory Visit (HOSPITAL_COMMUNITY): Payer: Self-pay | Admitting: Family Medicine

## 2022-10-18 DIAGNOSIS — Z1231 Encounter for screening mammogram for malignant neoplasm of breast: Secondary | ICD-10-CM

## 2022-10-29 ENCOUNTER — Ambulatory Visit (HOSPITAL_COMMUNITY)
Admission: RE | Admit: 2022-10-29 | Discharge: 2022-10-29 | Disposition: A | Discharge status: Home / Self Care | Payer: HMO | Source: Ambulatory Visit | Attending: Family Medicine | Admitting: Family Medicine

## 2022-10-29 DIAGNOSIS — Z1231 Encounter for screening mammogram for malignant neoplasm of breast: Secondary | ICD-10-CM | POA: Diagnosis not present

## 2022-11-15 DIAGNOSIS — E669 Obesity, unspecified: Secondary | ICD-10-CM | POA: Diagnosis not present

## 2022-11-15 DIAGNOSIS — E1165 Type 2 diabetes mellitus with hyperglycemia: Secondary | ICD-10-CM | POA: Diagnosis not present

## 2022-11-15 DIAGNOSIS — M810 Age-related osteoporosis without current pathological fracture: Secondary | ICD-10-CM | POA: Diagnosis not present

## 2022-11-15 DIAGNOSIS — Z794 Long term (current) use of insulin: Secondary | ICD-10-CM | POA: Diagnosis not present

## 2022-11-15 DIAGNOSIS — I10 Essential (primary) hypertension: Secondary | ICD-10-CM | POA: Diagnosis not present

## 2022-11-15 DIAGNOSIS — Z9849 Cataract extraction status, unspecified eye: Secondary | ICD-10-CM | POA: Diagnosis not present

## 2022-11-15 DIAGNOSIS — E1169 Type 2 diabetes mellitus with other specified complication: Secondary | ICD-10-CM | POA: Diagnosis not present

## 2022-11-15 DIAGNOSIS — E785 Hyperlipidemia, unspecified: Secondary | ICD-10-CM | POA: Diagnosis not present

## 2022-11-22 ENCOUNTER — Other Ambulatory Visit (HOSPITAL_COMMUNITY): Payer: Self-pay

## 2022-11-22 MED ORDER — EZETIMIBE 10 MG PO TABS
10.0000 mg | ORAL_TABLET | Freq: Every day | ORAL | 0 refills | Status: AC
  Filled 2022-11-22: qty 90, 90d supply, fill #0

## 2022-12-02 ENCOUNTER — Other Ambulatory Visit (HOSPITAL_COMMUNITY): Payer: Self-pay

## 2022-12-16 DIAGNOSIS — L84 Corns and callosities: Secondary | ICD-10-CM | POA: Diagnosis not present

## 2022-12-16 DIAGNOSIS — E1142 Type 2 diabetes mellitus with diabetic polyneuropathy: Secondary | ICD-10-CM | POA: Diagnosis not present

## 2022-12-16 DIAGNOSIS — B351 Tinea unguium: Secondary | ICD-10-CM | POA: Diagnosis not present

## 2023-01-17 DIAGNOSIS — E1169 Type 2 diabetes mellitus with other specified complication: Secondary | ICD-10-CM | POA: Diagnosis not present

## 2023-01-17 DIAGNOSIS — I10 Essential (primary) hypertension: Secondary | ICD-10-CM | POA: Diagnosis not present

## 2023-01-17 DIAGNOSIS — L638 Other alopecia areata: Secondary | ICD-10-CM | POA: Diagnosis not present

## 2023-01-17 DIAGNOSIS — E7849 Other hyperlipidemia: Secondary | ICD-10-CM | POA: Diagnosis not present

## 2023-01-17 DIAGNOSIS — L438 Other lichen planus: Secondary | ICD-10-CM | POA: Diagnosis not present

## 2023-01-17 DIAGNOSIS — Z6832 Body mass index (BMI) 32.0-32.9, adult: Secondary | ICD-10-CM | POA: Diagnosis not present

## 2023-01-22 ENCOUNTER — Ambulatory Visit
Admission: EM | Admit: 2023-01-22 | Discharge: 2023-01-22 | Disposition: A | Discharge status: Home / Self Care | Payer: Self-pay | Attending: Family Medicine | Admitting: Family Medicine

## 2023-01-22 ENCOUNTER — Encounter: Payer: Self-pay | Admitting: Emergency Medicine

## 2023-01-22 ENCOUNTER — Other Ambulatory Visit: Payer: Self-pay

## 2023-01-22 DIAGNOSIS — Z1152 Encounter for screening for COVID-19: Secondary | ICD-10-CM

## 2023-01-22 NOTE — ED Triage Notes (Signed)
Pt reports is traveling international on Monday and needs covid test prior to trip.

## 2023-01-22 NOTE — ED Provider Notes (Signed)
[facility]    CSN: [id number] Arrival date & time: 01/22/23  0947      History   Chief Complaint No chief complaint on file.   HPI TERSA SCHROFF is a 77 y.o. female.   Patient presenting today requesting a COVID test for travel to [place].  She denies any symptoms or exposures.  Feeling well and in her usual state of health.    Past Medical History:  Diagnosis Date   Diabetes mellitus    Hyperlipidemia    Obesity     Patient Active Problem List   Diagnosis Date Noted   Diabetes (HCC) 03/05/2013    Past Surgical History:  Procedure Laterality Date   CATARACT EXTRACTION Right    CATARACT EXTRACTION W/PHACO Left 04/04/2014   Procedure: CATARACT EXTRACTION PHACO AND INTRAOCULAR LENS PLACEMENT LEFT EYE;  Surgeon: Gemma Payor, MD;  Location: [facility];  Service: Ophthalmology;  Laterality: Left;  CDE: 13.36   EYE SURGERY     MYOMECTOMY  2005    OB History   No obstetric history on file.      Home Medications    Prior to Admission medications   Medication Sig Start Date End Date Taking? Authorizing Provider  Semaglutide (OZEMPIC, 0.25 OR 0.5 MG/DOSE, [place]) Inject into the skin.   Yes [provider]  Accu-Chek Softclix Lancets lancets Use as directed two times daily. 08/18/21     Blood Glucose Monitoring Suppl (BLOOD GLUCOSE MONITOR SYSTEM) w/Device KIT Use as directed two times daily. 08/18/21     Blood Glucose Monitoring Suppl (FREESTYLE LITE) w/Device KIT Use as directed 11/12/20     Blood Glucose Monitoring Suppl (ONETOUCH VERIO FLEX SYSTEM) w/Device KIT Use to test blood sugar 4 times a day 09/14/21     Canagliflozin (INVOKANA) 300 MG TABS Take 300 mg by mouth daily before breakfast.     [provider]  dorzolamide (TRUSOPT) 2 % ophthalmic solution 1 drop 3 (three) times daily.    [provider]  Dulaglutide (TRULICITY) 1.5 MG/0.5ML SOPN Inject 1.5 mg into the skin once a week. 05/06/21   Renaye Rakers, MD  empagliflozin  (JARDIANCE) 25 MG TABS tablet Take 1 tablet (25 mg total) by mouth daily. 06/17/22     ergocalciferol (VITAMIN D2) 1.25 MG (50000 UT) capsule Take 1 capsule by mouth once a week for deficiency 03/10/22     ezetimibe (ZETIA) 10 MG tablet TAKE 1 TABLET BY MOUTH ONCE DAILY 10/11/19 10/10/20  Renaye Rakers, MD  ezetimibe (ZETIA) 10 MG tablet Take 1 tablet (10 mg total) by mouth daily. 10/08/21     ezetimibe (ZETIA) 10 MG tablet Take 1 tablet (10 mg total) by mouth daily. 11/20/22     glucose blood (FREESTYLE LITE) test strip Use as directed daily 11/12/20     glucose blood test strip Use as directed 2 (two) times daily. 10/13/20     glucose blood test strip Use as directed two times daily. 08/18/21     glucose blood test strip Use to test blood sugar  4 times daily 09/14/21     hydrochlorothiazide (HYDRODIURIL) 25 MG tablet Take 1 tablet (25 mg total) by mouth daily. 10/08/21     insulin glargine (LANTUS) 100 UNIT/ML injection Inject 30 Units into the skin at bedtime.    [provider]  insulin glargine (LANTUS) 100 UNIT/ML Solostar Pen INJECT 38 UNITS INTO THE SKIN AT BEDTIME. 02/22/20 02/21/21  Renaye Rakers, MD  Insulin Pen Needle (UNIFINE PENTIPS) 31G  X 8 MM MISC Use to give insulin as directed twice daily. 12/01/21     Lancets (FREESTYLE) lancets Use as directed for daily testing 11/12/20     Lancets (ONETOUCH DELICA PLUS LANCET33G) MISC Use to test blood sugar 4 times daily 09/14/21     linagliptin (TRADJENTA) 5 MG TABS tablet Take 5 mg by mouth every evening.     [provider]  metFORMIN (GLUCOPHAGE) 1000 MG tablet Take 1,000 mg by mouth 2 (two) times daily with a meal.     [provider]  metFORMIN (GLUCOPHAGE) 1000 MG tablet TAKE 2 TABLETS BY MOUTH EVERY EVENING 10/15/19 10/14/20  Renaye Rakers, MD  metFORMIN (GLUCOPHAGE) 1000 MG tablet TAKE 2 TABLETS BY MOUTH EVERY EVENING 10/11/19 01/08/21  Renaye Rakers, MD  metFORMIN (GLUCOPHAGE) 1000 MG tablet Take 2 tablets (2,000 mg total) by  mouth every evening. 01/13/21     metFORMIN (GLUCOPHAGE) 1000 MG tablet Take 1 tablet (1,000 mg total) by mouth 2 (two) times daily. 01/28/22     mometasone (NASONEX) 50 MCG/ACT nasal spray Place 2 sprays into the nose daily as needed.     [provider]  naproxen (NAPROSYN) 250 MG tablet Take 1 tablet (250 mg total) by mouth 2 (two) times daily with a meal. 01/16/18   Burky, Dorene Grebe B, NP  telmisartan (MICARDIS) 40 MG tablet TAKE 1 TABLET BY MOUTH ONCE DAILY. 11/07/19 11/06/20  Renaye Rakers, MD  telmisartan (MICARDIS) 40 MG tablet Take 1 tablet (40 mg total) by mouth daily. 04/19/22     valsartan (DIOVAN) 40 MG tablet Take 40 mg by mouth daily.    [provider]    Family History Family History  Problem Relation Age of Onset   Cancer Other    Obesity Other     Social History Social History   Tobacco Use   Smoking status: Never   Smokeless tobacco: Never  Vaping Use   Vaping status: Never Used  Substance Use Topics   Alcohol use: No   Drug use: No     Allergies   Augmentin [amoxicillin-pot clavulanate] and Fish allergy   Review of Systems Review of Systems HPI  Physical Exam Triage Vital Signs ED Triage Vitals  Encounter Vitals Group     BP 01/22/23 1019 120/73     Systolic BP Percentile --      Diastolic BP Percentile --      Pulse Rate 01/22/23 1019 65     Resp 01/22/23 1019 20     Temp 01/22/23 1019 98 F (36.7 C)     Temp Source 01/22/23 1019 Oral     SpO2 01/22/23 1019 99 %     Weight --      Height --      Head Circumference --      Peak Flow --      Pain Score 01/22/23 1015 0     Pain Loc --      Pain Education --      Exclude from Growth Chart --    No data found.  Updated Vital Signs BP 120/73 (BP Location: Right Arm)   Pulse 65   Temp 98 F (36.7 C) (Oral)   Resp 20   SpO2 99%   Visual Acuity Right Eye Distance:   Left Eye Distance:   Bilateral Distance:    Right Eye Near:   Left Eye Near:    Bilateral Near:      Physical Exam Vitals and nursing note  reviewed.  Constitutional:      Appearance: Normal appearance. She is not ill-appearing.  HENT:     Head: Atraumatic.  Eyes:     Extraocular Movements: Extraocular movements intact.     Conjunctiva/sclera: Conjunctivae normal.  Cardiovascular:     Rate and Rhythm: Normal rate and regular rhythm.     Heart sounds: Normal heart sounds.  Pulmonary:     Effort: Pulmonary effort is normal.     Breath sounds: Normal breath sounds.  Musculoskeletal:        General: Normal range of motion.     Cervical back: Normal range of motion and neck supple.  Skin:    General: Skin is warm and dry.  Neurological:     Mental Status: She is alert and oriented to person, place, and time.  Psychiatric:        Mood and Affect: Mood normal.        Thought Content: Thought content normal.        Judgment: Judgment normal.      UC Treatments / Results  Labs (all labs ordered are listed, but only abnormal results are displayed) Labs Reviewed  SARS CORONAVIRUS 2 (TAT 6-24 HRS)    EKG   Radiology No results found.  Procedures Procedures (including critical care time)  Medications Ordered in UC Medications - No data to display  Initial Impression / Assessment and Plan / UC Course  I have reviewed the triage vital signs and the nursing notes.  Pertinent labs & imaging results that were available during my care of the patient were reviewed by me and considered in my medical decision making (see chart for details).     COVID test for screening pending  Final Clinical Impressions(s) / UC Diagnoses   Final diagnoses:  Encounter for screening for COVID-19   Discharge Instructions   None    ED Prescriptions   None    PDMP not reviewed this encounter.   Particia Nearing, [place] 01/22/23 1046

## 2023-01-23 LAB — SARS CORONAVIRUS 2 (TAT 6-24 HRS): SARS Coronavirus 2: NEGATIVE

## 2023-03-22 DIAGNOSIS — L84 Corns and callosities: Secondary | ICD-10-CM | POA: Diagnosis not present

## 2023-03-22 DIAGNOSIS — E1142 Type 2 diabetes mellitus with diabetic polyneuropathy: Secondary | ICD-10-CM | POA: Diagnosis not present

## 2023-03-22 DIAGNOSIS — B351 Tinea unguium: Secondary | ICD-10-CM | POA: Diagnosis not present

## 2023-05-19 DIAGNOSIS — I10 Essential (primary) hypertension: Secondary | ICD-10-CM | POA: Diagnosis not present

## 2023-05-19 DIAGNOSIS — Z0000 Encounter for general adult medical examination without abnormal findings: Secondary | ICD-10-CM | POA: Diagnosis not present

## 2023-05-19 DIAGNOSIS — E7849 Other hyperlipidemia: Secondary | ICD-10-CM | POA: Diagnosis not present

## 2023-05-31 DIAGNOSIS — L84 Corns and callosities: Secondary | ICD-10-CM | POA: Diagnosis not present

## 2023-05-31 DIAGNOSIS — B351 Tinea unguium: Secondary | ICD-10-CM | POA: Diagnosis not present

## 2023-08-09 DIAGNOSIS — B351 Tinea unguium: Secondary | ICD-10-CM | POA: Diagnosis not present

## 2023-08-09 DIAGNOSIS — E1142 Type 2 diabetes mellitus with diabetic polyneuropathy: Secondary | ICD-10-CM | POA: Diagnosis not present

## 2023-08-09 DIAGNOSIS — L84 Corns and callosities: Secondary | ICD-10-CM | POA: Diagnosis not present

## 2023-09-07 NOTE — Progress Notes (Addendum)
   09/07/2023  Patient ID: Amanda Estes, female   DOB: [birth year], 78 y.o.   MRN: [id number]  Contacted patient regarding medication adherence from a quality report for [facility]. The patient failed MAD in 2024.     Per DrFirst fill history: Ozempic 1 mg - last filled 07/28/23 for a 28-day supply.  Telmisartan  40 mg - last filled 07/12/23 for a 90-day supply.  Jardiance  25 mg - last filled 07/07/23 for a 90-day supply. Rosuvastatin 10 mg - last filled 07/07/23 for a 90-day supply.   *Patient is also filling Lantus  and should be excluded from the MAD measure.    I will follow up for adherence monitoring.   Thank you for allowing pharmacy to be a part of this patient's care.    Livia Riffle, PharmD Clinical Pharmacist  [phone]

## 2023-09-15 ENCOUNTER — Telehealth: Payer: Self-pay

## 2023-09-15 ENCOUNTER — Other Ambulatory Visit (HOSPITAL_COMMUNITY): Payer: Self-pay

## 2023-09-15 NOTE — Telephone Encounter (Signed)
 Pharmacy Patient Advocate Encounter  Insurance verification completed.   The patient is insured through [facility] [employer]   Ran test claim for Jardiance . Currently a quantity of 90 is a 90 day supply and the co-pay is $0 .   This test claim was processed through [facility]- copay amounts may vary at other pharmacies due to pharmacy/plan contracts, or as the patient moves through the different stages of their insurance plan.

## 2023-09-16 DIAGNOSIS — E118 Type 2 diabetes mellitus with unspecified complications: Secondary | ICD-10-CM | POA: Diagnosis not present

## 2023-09-16 DIAGNOSIS — Z6833 Body mass index (BMI) 33.0-33.9, adult: Secondary | ICD-10-CM | POA: Diagnosis not present

## 2023-09-16 DIAGNOSIS — E669 Obesity, unspecified: Secondary | ICD-10-CM | POA: Diagnosis not present

## 2023-09-16 DIAGNOSIS — B3731 Acute candidiasis of vulva and vagina: Secondary | ICD-10-CM | POA: Diagnosis not present

## 2023-09-16 DIAGNOSIS — E1169 Type 2 diabetes mellitus with other specified complication: Secondary | ICD-10-CM | POA: Diagnosis not present

## 2023-09-16 DIAGNOSIS — I10 Essential (primary) hypertension: Secondary | ICD-10-CM | POA: Diagnosis not present

## 2023-09-16 DIAGNOSIS — E785 Hyperlipidemia, unspecified: Secondary | ICD-10-CM | POA: Diagnosis not present

## 2023-09-19 ENCOUNTER — Other Ambulatory Visit (HOSPITAL_COMMUNITY): Payer: Self-pay | Admitting: Family Medicine

## 2023-09-19 DIAGNOSIS — Z1231 Encounter for screening mammogram for malignant neoplasm of breast: Secondary | ICD-10-CM

## 2023-09-27 DIAGNOSIS — E113292 Type 2 diabetes mellitus with mild nonproliferative diabetic retinopathy without macular edema, left eye: Secondary | ICD-10-CM | POA: Diagnosis not present

## 2023-10-14 DIAGNOSIS — I10 Essential (primary) hypertension: Secondary | ICD-10-CM | POA: Diagnosis not present

## 2023-10-14 DIAGNOSIS — I499 Cardiac arrhythmia, unspecified: Secondary | ICD-10-CM | POA: Diagnosis not present

## 2023-10-14 DIAGNOSIS — E1169 Type 2 diabetes mellitus with other specified complication: Secondary | ICD-10-CM | POA: Diagnosis not present

## 2023-10-31 ENCOUNTER — Ambulatory Visit (HOSPITAL_COMMUNITY)
Admission: RE | Admit: 2023-10-31 | Discharge: 2023-10-31 | Disposition: A | Discharge status: Home / Self Care | Payer: Self-pay | Source: Ambulatory Visit | Attending: Family Medicine | Admitting: Family Medicine

## 2023-10-31 DIAGNOSIS — Z1231 Encounter for screening mammogram for malignant neoplasm of breast: Secondary | ICD-10-CM | POA: Insufficient documentation

## 2023-11-01 DIAGNOSIS — B351 Tinea unguium: Secondary | ICD-10-CM | POA: Diagnosis not present

## 2023-11-01 DIAGNOSIS — E1142 Type 2 diabetes mellitus with diabetic polyneuropathy: Secondary | ICD-10-CM | POA: Diagnosis not present

## 2023-11-01 DIAGNOSIS — L84 Corns and callosities: Secondary | ICD-10-CM | POA: Diagnosis not present

## 2023-11-25 DIAGNOSIS — E1169 Type 2 diabetes mellitus with other specified complication: Secondary | ICD-10-CM | POA: Diagnosis not present

## 2024-01-10 DIAGNOSIS — L84 Corns and callosities: Secondary | ICD-10-CM | POA: Diagnosis not present

## 2024-01-10 DIAGNOSIS — E1142 Type 2 diabetes mellitus with diabetic polyneuropathy: Secondary | ICD-10-CM | POA: Diagnosis not present

## 2024-01-10 DIAGNOSIS — B351 Tinea unguium: Secondary | ICD-10-CM | POA: Diagnosis not present

## 2024-03-15 ENCOUNTER — Other Ambulatory Visit (HOSPITAL_COMMUNITY): Payer: Self-pay

## 2024-03-20 DIAGNOSIS — E1142 Type 2 diabetes mellitus with diabetic polyneuropathy: Secondary | ICD-10-CM | POA: Diagnosis not present

## 2024-03-20 DIAGNOSIS — B351 Tinea unguium: Secondary | ICD-10-CM | POA: Diagnosis not present

## 2024-03-20 DIAGNOSIS — L84 Corns and callosities: Secondary | ICD-10-CM | POA: Diagnosis not present

## 2024-03-23 DIAGNOSIS — R6510 Systemic inflammatory response syndrome (SIRS) of non-infectious origin without acute organ dysfunction: Secondary | ICD-10-CM | POA: Diagnosis not present

## 2024-03-23 DIAGNOSIS — E785 Hyperlipidemia, unspecified: Secondary | ICD-10-CM | POA: Diagnosis not present

## 2024-03-23 DIAGNOSIS — E559 Vitamin D deficiency, unspecified: Secondary | ICD-10-CM | POA: Diagnosis not present

## 2024-03-23 DIAGNOSIS — E118 Type 2 diabetes mellitus with unspecified complications: Secondary | ICD-10-CM | POA: Diagnosis not present
